# Patient Record
Sex: Female | Born: 1995 | Race: Black or African American | Hispanic: No | Marital: Single | Smoking: Never smoker
Health system: Southern US, Community
[De-identification: ages and names within clinical notes are randomized; demographics above are authoritative.]

## PROBLEM LIST (undated history)

## (undated) ENCOUNTER — Ambulatory Visit (HOSPITAL_COMMUNITY): Admission: EM | Payer: 59 | Source: Home / Self Care

## (undated) ENCOUNTER — Inpatient Hospital Stay (HOSPITAL_COMMUNITY): Payer: Medicaid Other

## (undated) DIAGNOSIS — F319 Bipolar disorder, unspecified: Secondary | ICD-10-CM

## (undated) DIAGNOSIS — F419 Anxiety disorder, unspecified: Secondary | ICD-10-CM

---
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
## 2015-10-04 ENCOUNTER — Emergency Department (HOSPITAL_COMMUNITY)
Admission: EM | Admit: 2015-10-04 | Discharge: 2015-10-04 | Disposition: A | Discharge status: Home / Self Care | Payer: BLUE CROSS/BLUE SHIELD | Attending: Emergency Medicine | Admitting: Emergency Medicine

## 2015-10-04 ENCOUNTER — Encounter (HOSPITAL_COMMUNITY): Payer: Self-pay

## 2015-10-04 DIAGNOSIS — R55 Syncope and collapse: Secondary | ICD-10-CM

## 2015-10-04 DIAGNOSIS — R51 Headache: Secondary | ICD-10-CM | POA: Diagnosis not present

## 2015-10-04 LAB — BASIC METABOLIC PANEL
Anion gap: 4 — ABNORMAL LOW (ref 5–15)
BUN: 8 mg/dL (ref 6–20)
CO2: 22 mmol/L (ref 22–32)
Calcium: 8.7 mg/dL — ABNORMAL LOW (ref 8.9–10.3)
Chloride: 111 mmol/L (ref 101–111)
Creatinine, Ser: 0.6 mg/dL (ref 0.44–1.00)
GFR calc Af Amer: >60 mL/min (ref >60)
GFR calc non Af Amer: >60 mL/min (ref >60)
GLUCOSE: 78 mg/dL (ref 65–99)
POTASSIUM: 4.1 mmol/L (ref 3.5–5.1)
Sodium: 137 mmol/L (ref 135–145)

## 2015-10-04 LAB — URINALYSIS, ROUTINE W REFLEX MICROSCOPIC
Bilirubin Urine: NEGATIVE
GLUCOSE, UA: NEGATIVE mg/dL
Hgb urine dipstick: NEGATIVE
KETONES UR: NEGATIVE mg/dL
Leukocytes, UA: NEGATIVE
Nitrite: NEGATIVE
Protein, ur: NEGATIVE mg/dL
SPECIFIC GRAVITY, URINE: 1.022 (ref 1.005–1.030)
pH: 6 (ref 5.0–8.0)

## 2015-10-04 LAB — CBC
HCT: 41.1 % (ref 36.0–46.0)
Hemoglobin: 13.8 g/dL (ref 12.0–15.0)
MCH: 31.4 pg (ref 26.0–34.0)
MCHC: 33.6 g/dL (ref 30.0–36.0)
MCV: 93.6 fL (ref 78.0–100.0)
Platelets: 273 10*3/uL (ref 150–400)
RBC: 4.39 MIL/uL (ref 3.87–5.11)
RDW: 13.2 % (ref 11.5–15.5)
WBC: 6 10*3/uL (ref 4.0–10.5)

## 2015-10-04 LAB — I-STAT TROPONIN, ED: Troponin i, poc: 0 ng/mL (ref 0.00–0.08)

## 2015-10-04 LAB — CBG MONITORING, ED: GLUCOSE-CAPILLARY: 81 mg/dL (ref 65–99)

## 2015-10-04 LAB — D-DIMER, QUANTITATIVE: D-Dimer, Quant: <0.27 ug/mL-FEU (ref 0.00–0.50)

## 2015-10-04 LAB — POC URINE PREG, ED: Preg Test, Ur: NEGATIVE

## 2015-10-04 MED ORDER — KETOROLAC TROMETHAMINE 30 MG/ML IJ SOLN
30.0000 mg | Freq: Once | INTRAMUSCULAR | Status: AC
  Administered 2015-10-04: 30 mg via INTRAVENOUS
  Filled 2015-10-04: qty 1

## 2015-10-04 MED ORDER — LORAZEPAM 0.5 MG PO TABS
0.5000 mg | ORAL_TABLET | Freq: Once | ORAL | Status: AC
  Administered 2015-10-04: 0.5 mg via ORAL
  Filled 2015-10-04: qty 1

## 2015-10-04 MED ORDER — SODIUM CHLORIDE 0.9 % IV BOLUS (SEPSIS)
1000.0000 mL | Freq: Once | INTRAVENOUS | Status: AC
  Administered 2015-10-04: 1000 mL via INTRAVENOUS

## 2015-10-04 NOTE — Discharge Instructions (Signed)
Make sure to drink plenty of fluids. Follow up with a family doctor if continue to have dizziness or if any more syncopal episodes. Return if worsening.     Syncope Syncope is a medical term for fainting or passing out. This means you lose consciousness and drop to the ground. People are generally unconscious for less than 5 minutes. You may have some muscle twitches for up to 15 seconds before waking up and returning to normal. Syncope occurs more often in older adults, but it can happen to anyone. While most causes of syncope are not dangerous, syncope can be a sign of a serious medical problem. It is important to seek medical care.  CAUSES  Syncope is caused by a sudden drop in blood flow to the brain. The specific cause is often not determined. Factors that can bring on syncope include:  Taking medicines that lower blood pressure.  Sudden changes in posture, such as standing up quickly.  Taking more medicine than prescribed.  Standing in one place for too long.  Seizure disorders.  Dehydration and excessive exposure to heat.  Low blood sugar (hypoglycemia).  Straining to have a bowel movement.  Heart disease, irregular heartbeat, or other circulatory problems.  Fear, emotional distress, seeing blood, or severe pain. SYMPTOMS  Right before fainting, you may:  Feel dizzy or light-headed.  Feel nauseous.  See all white or all black in your field of vision.  Have cold, clammy skin. DIAGNOSIS  Your health care provider will ask about your symptoms, perform a physical exam, and perform an electrocardiogram (ECG) to record the electrical activity of your heart. Your health care provider may also perform other heart or blood tests to determine the cause of your syncope which may include:  Transthoracic echocardiogram (TTE). During echocardiography, sound waves are used to evaluate how blood flows through your heart.  Transesophageal echocardiogram (TEE).  Cardiac monitoring.  This allows your health care provider to monitor your heart rate and rhythm in real time.  Holter monitor. This is a portable device that records your heartbeat and can help diagnose heart arrhythmias. It allows your health care provider to track your heart activity for several days, if needed.  Stress tests by exercise or by giving medicine that makes the heart beat faster. TREATMENT  In most cases, no treatment is needed. Depending on the cause of your syncope, your health care provider may recommend changing or stopping some of your medicines. HOME CARE INSTRUCTIONS  Have someone stay with you until you feel stable.  Do not drive, use machinery, or play sports until your health care provider says it is okay.  Keep all follow-up appointments as directed by your health care provider.  Lie down right away if you start feeling like you might faint. Breathe deeply and steadily. Wait until all the symptoms have passed.  Drink enough fluids to keep your urine clear or pale yellow.  If you are taking blood pressure or heart medicine, get up slowly and take several minutes to sit and then stand. This can reduce dizziness. SEEK IMMEDIATE MEDICAL CARE IF:   You have a severe headache.  You have unusual pain in the chest, abdomen, or back.  You are bleeding from your mouth or rectum, or you have black or tarry stool.  You have an irregular or very fast heartbeat.  You have pain with breathing.  You have repeated fainting or seizure-like jerking during an episode.  You faint when sitting or lying down.  You  have confusion.  You have trouble walking.  You have severe weakness.  You have vision problems. If you fainted, call your local emergency services (911 in [place] Do not drive yourself to the hospital.    This information is not intended to replace advice given to you by your health care provider. Make sure you discuss any questions you have with your health care provider.     Document Released: 03/24/2005 Document Revised: 08/08/2014 Document Reviewed: 05/23/2011 Elsevier Interactive Patient Education [employer]2016 [employer] Inc[employer]

## 2015-10-04 NOTE — ED Notes (Signed)
Bed: WTR8 Expected date:  Expected time:  Means of arrival:  Comments: EMS-syncope-triage

## 2015-10-04 NOTE — ED Provider Notes (Signed)
CSN: [id number]     Arrival date & time 10/04/15  1207 History   First MD Initiated Contact with Patient 10/04/15 1504     Chief Complaint  Patient presents with  . Loss of Consciousness     (Consider location/radiation/quality/duration/timing/severity/associated sxs/prior Treatment) HPI Wendy Pitts is a 20 y.o. female with no medical problems, presents to emergency department complaining of syncopal episode. 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 She states that for the last several days she has had mild headache, also reports she has had some chest pain that is intermittent and associated with shortness of breath. She denies any complaints at this time other than the headache. She has not tried taking any medications for this headache. She did not take anything this morning. She states that she has not had any to eat or drink this morning. States she had no appetite yesterday and did not eat or drink much yesterday either. She denies any fever or chills. No neck pain or stiffness. No chest pain, nausea, vomiting, diarrhea, abdominal pain at this time. No swelling in extremities. Denies being pregnant. She has had syncopal episodes in the past, with no diagnosis.  History reviewed. No pertinent past medical history. History reviewed. No pertinent past surgical history. No family history on file. Social History  Substance Use Topics  . Smoking status: Never Smoker   . Smokeless tobacco: Never Used  . Alcohol Use: No   OB History    No data available     Review of Systems  Constitutional: Negative for fever and chills.  Respiratory: Negative for cough, chest tightness and shortness of breath.   Cardiovascular: Negative for chest pain, palpitations and leg swelling.  Gastrointestinal: Negative for nausea, vomiting, abdominal pain  and diarrhea.  Genitourinary: Negative for dysuria, flank pain and vaginal bleeding.  Musculoskeletal: Negative for myalgias, arthralgias, neck pain and neck stiffness.  Skin: Negative for rash.  Neurological: Positive for syncope, light-headedness and headaches. Negative for dizziness and weakness.  All other systems reviewed and are negative.     Allergies  Review of patient's allergies indicates no known allergies.  Home Medications   Prior to Admission medications   Not on File   BP 104/66 mmHg  Pulse 73  Temp(Src) 98.5 F (36.9 C) (Oral)  Resp 16  Ht 5\' 4"  (1.626 m)  Wt 108.863 kg  BMI 41.18 kg/m2  SpO2 100%  LMP 08/21/2015 (Approximate) Physical Exam  Constitutional: She is oriented to person, place, and time. She appears well-developed and well-nourished. No distress.  HENT:  Head: Normocephalic and atraumatic.  Right Ear: External ear normal.  Left Ear: External ear normal.  Nose: Nose normal.  Mouth/Throat: Oropharynx is clear and moist.  Eyes: Conjunctivae and EOM are normal. Pupils are equal, round, and reactive to light.  Neck: Normal range of motion. Neck supple.  Cardiovascular: Normal rate, regular rhythm and normal heart sounds.   Pulmonary/Chest: Effort normal and breath sounds normal. No respiratory distress. She has no wheezes. She has no rales. She exhibits no tenderness.  Abdominal: Soft. Bowel sounds are normal. She exhibits no distension. There is no tenderness. There is no rebound.  Musculoskeletal: She exhibits no edema.  Neurological: She is alert and oriented to person, place, and time. No cranial nerve deficit. Coordination normal.  Skin: Skin is warm and dry.  Psychiatric:  She has a normal mood and affect. Her behavior is normal.  Nursing note and vitals reviewed.   ED Course  Procedures (including critical care time) Labs Review Labs Reviewed  BASIC METABOLIC PANEL - Abnormal; Notable for the following:    Calcium 8.7 (*)    Anion  gap 4 (*)    All other components within normal limits  CBC  URINALYSIS, ROUTINE W REFLEX MICROSCOPIC ([facility])  D-DIMER, QUANTITATIVE (NOT [facility])  CBG MONITORING, ED  CBG MONITORING, ED  POC URINE PREG, ED  I-STAT TROPOININ, ED    Imaging Review No results found. I have personally reviewed and evaluated these images and lab results as part of my medical decision-making.   EKG Interpretation   Date/Time:  Thursday October 04 2015 12:24:06 EDT Ventricular Rate:  68 PR Interval:    QRS Duration: 76 QT Interval:  394 QTC Calculation: 419 R Axis:   66 Text Interpretation:  Sinus rhythm Confirmed by Lincoln Brighamees, Liz [phone]([id number]) on  10/04/2015 2:53:33 PM      MDM   Final diagnoses:  Syncope, unspecified syncope type    Patient emergency department after syncopal episode at work. She states she hasn't had anything to drink or eat today. Denies being pregnant. History of similar syncopal episodes in the past. No diagnosis. Will check labs, d-dimer, pregnancy test, EKG.  EKG is unremarkable. CBC and basic metabolic panel all is negative. Troponin negative, d-dimer negative. Patient is mildly orthostatic. Hydrated with IV fluids. Patient requesting to be discharged home, states she feels better after fluids and Toradol for headache. Will discharge home with oral hydration and follow-up with family doctor.  Filed Vitals:   10/04/15 1212 10/04/15 1545 10/04/15 1548 10/04/15 1550  BP: 104/66 105/48 124/93 93/69  Pulse: 73 69 87 74  Temp: 98.5 F (36.9 C)     TempSrc: Oral     Resp: 16 18 18 19   Height: 5\' 4"  (1.626 m)     Weight: 108.863 kg     SpO2: 100% 99% 100%        Wendy Crumbleatyana Nakeem Murnane, Wendy Pitts 10/04/15 2024  Tilden FossaElizabeth Rees, MD 10/06/15 1159

## 2015-10-04 NOTE — ED Notes (Signed)
Pt presents with c/o positive LOC while at work. 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 Pt reported that she does remember feeling dizzy and sweaty prior to the syncopal episode. Per EMS, initial pressure of 116/84 but dropped to 68/42 en route. Pt has a 20 g IV in left hand placed by EMS with 500 ml of fluid given en route. Updated last pressure from EMS 110/68. Pt is alert and oriented at this time per EMS. Per EMS, positive orthostatic changes on scene, associated dizziness with standing. Pt c/o nausea and headache but denied any desire for zofran en route. Pt passed spinal exam per EMS, towel in place for precaution. Pt fell on tile floor, no obvious abnormalities, unsure of whether she hit her head.

## 2015-10-04 NOTE — Progress Notes (Signed)
Patient listed as having [employer] insurance without a pcp.  Patient reports she has just moved here.  [facility] who accept BCBS insurance within a 15 mile radius of patient's zip code [phone].  No further EDCM needs at this time.

## 2016-03-27 ENCOUNTER — Emergency Department (HOSPITAL_COMMUNITY): Payer: BLUE CROSS/BLUE SHIELD

## 2016-03-27 ENCOUNTER — Emergency Department (HOSPITAL_COMMUNITY)
Admission: EM | Admit: 2016-03-27 | Discharge: 2016-03-27 | Disposition: A | Discharge status: Home / Self Care | Payer: BLUE CROSS/BLUE SHIELD | Attending: Emergency Medicine | Admitting: Emergency Medicine

## 2016-03-27 ENCOUNTER — Encounter (HOSPITAL_COMMUNITY): Payer: Self-pay | Admitting: Emergency Medicine

## 2016-03-27 DIAGNOSIS — M542 Cervicalgia: Secondary | ICD-10-CM | POA: Diagnosis not present

## 2016-03-27 DIAGNOSIS — A599 Trichomoniasis, unspecified: Secondary | ICD-10-CM | POA: Insufficient documentation

## 2016-03-27 DIAGNOSIS — J020 Streptococcal pharyngitis: Secondary | ICD-10-CM | POA: Diagnosis not present

## 2016-03-27 DIAGNOSIS — J029 Acute pharyngitis, unspecified: Secondary | ICD-10-CM | POA: Diagnosis present

## 2016-03-27 DIAGNOSIS — N898 Other specified noninflammatory disorders of vagina: Secondary | ICD-10-CM

## 2016-03-27 DIAGNOSIS — Z79899 Other long term (current) drug therapy: Secondary | ICD-10-CM | POA: Insufficient documentation

## 2016-03-27 DIAGNOSIS — R0981 Nasal congestion: Secondary | ICD-10-CM

## 2016-03-27 LAB — WET PREP, GENITAL
Clue Cells Wet Prep HPF POC: NONE SEEN
Sperm: NONE SEEN
Yeast Wet Prep HPF POC: NONE SEEN

## 2016-03-27 LAB — RAPID STREP SCREEN (MED CTR MEBANE ONLY): Streptococcus, Group A Screen (Direct): POSITIVE — AB

## 2016-03-27 LAB — URINALYSIS, ROUTINE W REFLEX MICROSCOPIC
Bilirubin Urine: NEGATIVE
Glucose, UA: NEGATIVE mg/dL
HGB URINE DIPSTICK: NEGATIVE
Ketones, ur: 5 mg/dL — AB
Leukocytes, UA: NEGATIVE
Nitrite: NEGATIVE
PH: 6 (ref 5.0–8.0)
Protein, ur: NEGATIVE mg/dL
SPECIFIC GRAVITY, URINE: 1.031 — AB (ref 1.005–1.030)

## 2016-03-27 LAB — RAPID HIV SCREEN (HIV 1/2 AB+AG)
HIV 1/2 ANTIBODIES: NONREACTIVE
HIV-1 P24 Antigen - HIV24: NONREACTIVE

## 2016-03-27 LAB — POC URINE PREG, ED: Preg Test, Ur: NEGATIVE

## 2016-03-27 LAB — MONONUCLEOSIS SCREEN: Mono Screen: NEGATIVE

## 2016-03-27 MED ORDER — METRONIDAZOLE 500 MG PO TABS
2000.0000 mg | ORAL_TABLET | Freq: Once | ORAL | Status: AC
  Administered 2016-03-27: 2000 mg via ORAL
  Filled 2016-03-27: qty 4

## 2016-03-27 MED ORDER — AMOXICILLIN 500 MG PO CAPS: 500.0000 mg | ORAL_CAPSULE | Freq: Two times a day (BID) | ORAL | 0 refills | Status: AC

## 2016-03-27 NOTE — ED Notes (Signed)
When RN tried to place IV, IV was flushing and had appropriate blood return but the patient stated that she wanted it taken out.  Another RN verified that it was a working IV & explained to patient that it ws needed for the CT with contrast, but again patient demanded that it be removed. IV was removed and patient refused being stuck by anyone else.  PA was informed.

## 2016-03-27 NOTE — ED Notes (Signed)
PA is in the room doing a pelvic

## 2016-03-27 NOTE — ED Provider Notes (Signed)
[facility]-EMERGENCY DEPT Provider Note   CSN: [id number] Arrival date & time: 03/27/16  1600  History   Chief Complaint Chief Complaint  Patient presents with  . Otalgia  . Ear Fullness  . Vaginal Discharge    HPI Wendy Pitts is a 20 y.o. female with no pmh who presents with two concerns.   Pt reports severe sore throat, bilateral ear pain with fullness and muffled hearing, swollen and tender anterior neck, nasal congestion, productive cough, sinus pressure and pain, fever.  Symptoms x 1 week.  Pt states her niece was diagnosed with strep throat and ear infection recently, pt lives with her niece.  Pt states her boss today told her her neck looked swollen and recommended she went to the doctor.    Pt also reports vaginal itching, vaginal discharge.  Pt states vaginal discharge is white and clumpy.  No vaginal odors.  Pt denies urinary symptoms, low back pain, pelvic pain.  Pt is sexually active without use of barrier or hormonal contraception.  LMP 02/25/16.  Pt unsure of pregnancy status.  Pt requests STD testing today.  Pt states she has been tested for STDs before and has never tested positive. No nausea, vomiting, abdominal pain, diarrhea, constipation, vaginal sores.  HPI  History reviewed. No pertinent past medical history.  There are no active problems to display for this patient.   History reviewed. No pertinent surgical history.  OB History    No data available       Home Medications    Prior to Admission medications   Medication Sig Start Date End Date Taking? Authorizing Provider  diphenhydramine-acetaminophen (TYLENOL PM) 25-500 MG TABS tablet Take 2 tablets by mouth at bedtime as needed (sleep).   Yes Historical Provider, MD  amoxicillin (AMOXIL) 500 MG capsule Take 1 capsule (500 mg total) by mouth 2 (two) times daily. 03/27/16 04/06/16  Liberty Handylaudia J Najee Cowens, PA-C    Family History No family history on file.  Social History Social History  Substance Use  Topics  . Smoking status: Never Smoker  . Smokeless tobacco: Never Used  . Alcohol use No     Allergies   Patient has no known allergies.   Review of Systems Review of Systems  Constitutional: Positive for fever.  HENT: Positive for congestion, drooling, ear pain, rhinorrhea, sinus pain, sinus pressure, sore throat and trouble swallowing.   Eyes: Negative for pain and visual disturbance.  Respiratory: Negative for cough, chest tightness and shortness of breath.   Cardiovascular: Negative for chest pain.  Gastrointestinal: Negative for abdominal pain, anal bleeding, constipation, diarrhea, nausea and vomiting.  Genitourinary: Positive for vaginal discharge. Negative for difficulty urinating, dysuria, flank pain, frequency, hematuria, menstrual problem, pelvic pain and vaginal bleeding.  Musculoskeletal: Negative for back pain.  Skin: Negative for rash.  Neurological: Positive for headaches. Negative for dizziness and weakness.  Hematological: Does not bruise/bleed easily.  Psychiatric/Behavioral: The patient is not nervous/anxious.      Physical Exam Updated Vital Signs BP 128/76 (BP Location: Right Arm)   Pulse 96   Temp 98.3 F (36.8 C) (Oral)   Resp 14   Ht 5\' 4"  (1.626 m)   Wt 108.9 kg   SpO2 95%   BMI 41.20 kg/m   Physical Exam  Constitutional: She is oriented to person, place, and time. She appears well-developed and well-nourished. No distress.  HENT:  Head: Normocephalic and atraumatic.  S/p tonsillectomy. Oropharynx mildly erythematous and non edematous, moist. No pooling of oral secretions, no trismus.  No dental or sublingual fluctuance, swelling or tenderness. TM pearly gray with cone of light, non bulging non erythematous, no signs of fluids. Inferior turbinates mildly edematous and erythematous, no nasal discharge.   Eyes: Conjunctivae and EOM are normal. Pupils are equal, round, and reactive to light.  Neck:  ROM of neck normal, although pt reports  anterior neck pain with neck rotation, extension and flexion.  Mildly edematous anterior neck bilaterally.  Tender and swollen anterior cervical lymph nodes bilaterally.   Cardiovascular: Normal rate, regular rhythm and normal heart sounds.   No murmur heard. Pulmonary/Chest: Effort normal and breath sounds normal. She has no wheezes.  Abdominal:  Abdomen soft, non tender non distended without guarding or rigidity.  No suprapubic or CVA tenderness.   Genitourinary:  Genitourinary Comments: No external vaginal or anal lesions noted.  Thin, grayish white, malodorous vaginal discharge noted.  Cervix closed without lesions.  No CMT.  Ovaries non palpable.   Musculoskeletal: Normal range of motion.  Neurological: She is alert and oriented to person, place, and time.  Skin: Capillary refill takes less than 2 seconds. No rash noted.  Psychiatric: She has a normal mood and affect.     ED Treatments / Results  Labs (all labs ordered are listed, but only abnormal results are displayed) Labs Reviewed  RAPID STREP SCREEN (NOT [facility]) - Abnormal; Notable for the following:       Result Value   Streptococcus, Group A Screen (Direct) POSITIVE (*)    All other components within normal limits  WET PREP, GENITAL - Abnormal; Notable for the following:    Trich, Wet Prep PRESENT (*)    WBC, Wet Prep HPF POC MODERATE (*)    All other components within normal limits  URINALYSIS, ROUTINE W REFLEX MICROSCOPIC - Abnormal; Notable for the following:    Specific Gravity, Urine 1.031 (*)    Ketones, ur 5 (*)    All other components within normal limits  RAPID HIV SCREEN (HIV 1/2 AB+AG)  MONONUCLEOSIS SCREEN  POC URINE PREG, ED  GC/CHLAMYDIA PROBE AMP ([place]) NOT AT [facility]    EKG  EKG Interpretation None       Radiology Ct Soft Tissue Neck Wo Contrast  Result Date: 03/27/2016 CLINICAL DATA:  Anterior neck swelling and fever. EXAM: CT NECK WITHOUT CONTRAST TECHNIQUE: Multidetector CT imaging  of the neck was performed following the standard protocol without intravenous contrast. COMPARISON:  None. FINDINGS: Limited sensitivity due to noncontrast technique. Pharynx and larynx: Symmetric thickening of the adenoid tonsils. Mild enlargement of the palatini tonsils. No detected edema or mass. Widely patent airway. Salivary glands: Negative Thyroid: Negative Lymph nodes: Symmetric enlargement of bilateral cervical lymph nodes which retain a ovoid shape. No abnormal lymph node density. Vascular: Negative noncontrast appearance Limited intracranial: Negative Visualized orbits: Possible dysconjugate gaze, nonspecific. Mastoids and visualized paranasal sinuses: Mucous retention cysts in the right maxillary sinus. Skeleton: Negative. Upper chest: Negative IMPRESSION: Adenoid thickening and enlargement of bilateral cervical lymph nodes. In the setting of fever these changes are presumably infectious/reactive. Recommend clinical followup to normalization. Electronically Signed   By: Marnee SpringJonathon  Watts M.D.   On: 03/27/2016 19:30    Procedures Procedures (including critical care time)  Pelvic exam: normal external genitalia, vulva, vagina, cervix, uterus and adnexa.  No external vaginal or anal lesions noted.  Thin, grayish white, malodorous vaginal discharge noted.  Cervix closed without lesions.  No CMT.  Ovaries non palpable.  Tech in room for exam. Pt tolerated  procedures well without complications.     Medications Ordered in ED Medications  metroNIDAZOLE (FLAGYL) tablet 2,000 mg (2,000 mg Oral Given 03/27/16 1932)     Initial Impression / Assessment and Plan / ED Course  I have reviewed the triage vital signs and the nursing notes.  Pertinent labs & imaging results that were available during my care of the patient were reviewed by me and considered in my medical decision making (see chart for details).  Clinical Course as of Mar 29 1255  Thu Mar 27, 2016  1810 Pt declined IV for contrast for CT  neck. Will order non con CT neck  [CG]    Clinical Course User Index [CG] Liberty Handy, PA-C   +rapid strep.  + trich.  Given anterior neck swelling and pain, ordered CT neck with contrast to rule out deep neck abscess.  Pt declined IV contrast, ordered non con study which did not suggest signs of deep tissue abscess.  Discussed lab results with patient.  Advised her to communicate results with sexual partners as they need test and treatment.  Strict ED return instructions given - difficulty controlling oral secretions, neck stiffness, fevers, worsening anterior neck pain/swelling.  Pt verbalized understanding.  Pt given 2g flagyl in ED. Pt will be discharged with abx for strep and symptomatic treatment for sore throat.     Final Clinical Impressions(s) / ED Diagnoses   Final diagnoses:  Nasal congestion  Anterior neck pain  Sore throat  Vaginal discharge  Trichimoniasis  Strep pharyngitis    New Prescriptions Discharge Medication List as of 03/27/2016  8:19 PM       Liberty Handy, PA-C 03/28/16 1257    Liberty Handy, PA-C 03/28/16 1257    Canary Brim Tegeler, MD 03/28/16 1335

## 2016-03-27 NOTE — ED Triage Notes (Addendum)
Patient here from home with complaints of bilateral ear pain and fullness. Reports that she is having difficulty hearing out of both ears. Also complains of neck pain bilateral lymph nodes. Also here for STD testing. Vaginal itching and white thick discharge.

## 2016-03-27 NOTE — Discharge Instructions (Addendum)
You tested positive today for trichomoniasis.  Please read the attached information about "trichomoniasis" and "sexually transmitted diseases".  It is important that you speak to those with whom you have been sexually active because they need testing and treatment.   You tested positive for strep pharyngitis.  Please take amoxicillin as prescribed for the next 10 days.   Your CT scan came back negative for a deep tissue abscess.  Your neck swelling and pain is likely a reaction to strep.   Please return to emergency department if your symptoms worsen.

## 2016-03-27 NOTE — ED Notes (Signed)
Advised PA of patient results being back.  PA to talk with patient.

## 2016-03-28 LAB — GC/CHLAMYDIA PROBE AMP (~~LOC~~) NOT AT ARMC
Chlamydia: NEGATIVE
Neisseria Gonorrhea: NEGATIVE

## 2016-05-07 ENCOUNTER — Emergency Department (HOSPITAL_COMMUNITY): Payer: BLUE CROSS/BLUE SHIELD

## 2016-05-07 ENCOUNTER — Emergency Department (HOSPITAL_COMMUNITY)
Admission: EM | Admit: 2016-05-07 | Discharge: 2016-05-07 | Disposition: A | Discharge status: Home / Self Care | Payer: BLUE CROSS/BLUE SHIELD | Attending: Emergency Medicine | Admitting: Emergency Medicine

## 2016-05-07 ENCOUNTER — Encounter (HOSPITAL_COMMUNITY): Payer: Self-pay

## 2016-05-07 DIAGNOSIS — R112 Nausea with vomiting, unspecified: Secondary | ICD-10-CM | POA: Diagnosis not present

## 2016-05-07 DIAGNOSIS — N342 Other urethritis: Secondary | ICD-10-CM | POA: Insufficient documentation

## 2016-05-07 DIAGNOSIS — R197 Diarrhea, unspecified: Secondary | ICD-10-CM | POA: Diagnosis not present

## 2016-05-07 DIAGNOSIS — R109 Unspecified abdominal pain: Secondary | ICD-10-CM | POA: Diagnosis present

## 2016-05-07 LAB — POC URINE PREG, ED: PREG TEST UR: NEGATIVE

## 2016-05-07 LAB — COMPREHENSIVE METABOLIC PANEL
ALK PHOS: 73 U/L (ref 38–126)
ALT: 16 U/L (ref 14–54)
ANION GAP: 5 (ref 5–15)
AST: 21 U/L (ref 15–41)
Albumin: 3.6 g/dL (ref 3.5–5.0)
BILIRUBIN TOTAL: 0.5 mg/dL (ref 0.3–1.2)
BUN: 7 mg/dL (ref 6–20)
CALCIUM: 9.1 mg/dL (ref 8.9–10.3)
CO2: 24 mmol/L (ref 22–32)
Chloride: 109 mmol/L (ref 101–111)
Creatinine, Ser: 0.76 mg/dL (ref 0.44–1.00)
GLUCOSE: 80 mg/dL (ref 65–99)
POTASSIUM: 3.8 mmol/L (ref 3.5–5.1)
Sodium: 138 mmol/L (ref 135–145)
TOTAL PROTEIN: 7.2 g/dL (ref 6.5–8.1)

## 2016-05-07 LAB — URINALYSIS, ROUTINE W REFLEX MICROSCOPIC
BILIRUBIN URINE: NEGATIVE
Glucose, UA: 100 mg/dL — AB
Ketones, ur: 15 mg/dL — AB
NITRITE: POSITIVE — AB
Protein, ur: >300 mg/dL — AB
SPECIFIC GRAVITY, URINE: 1.025 (ref 1.005–1.030)
pH: 5 (ref 5.0–8.0)

## 2016-05-07 LAB — URINALYSIS, MICROSCOPIC (REFLEX)

## 2016-05-07 LAB — CBC
HCT: 41.4 % (ref 36.0–46.0)
HEMOGLOBIN: 13.7 g/dL (ref 12.0–15.0)
MCH: 31.5 pg (ref 26.0–34.0)
MCHC: 33.1 g/dL (ref 30.0–36.0)
MCV: 95.2 fL (ref 78.0–100.0)
Platelets: 221 10*3/uL (ref 150–400)
RBC: 4.35 MIL/uL (ref 3.87–5.11)
RDW: 13.4 % (ref 11.5–15.5)
WBC: 6.1 10*3/uL (ref 4.0–10.5)

## 2016-05-07 LAB — LIPASE, BLOOD: Lipase: 22 U/L (ref 11–51)

## 2016-05-07 MED ORDER — PROMETHAZINE HCL 25 MG/ML IJ SOLN
12.5000 mg | Freq: Once | INTRAMUSCULAR | Status: AC
  Administered 2016-05-07: 12.5 mg via INTRAVENOUS
  Filled 2016-05-07: qty 1

## 2016-05-07 MED ORDER — FLUCONAZOLE 100 MG PO TABS
150.0000 mg | ORAL_TABLET | Freq: Once | ORAL | Status: AC
  Administered 2016-05-07: 150 mg via ORAL
  Filled 2016-05-07: qty 2

## 2016-05-07 MED ORDER — SODIUM CHLORIDE 0.9 % IV BOLUS (SEPSIS)
1000.0000 mL | Freq: Once | INTRAVENOUS | Status: AC
  Administered 2016-05-07: 1000 mL via INTRAVENOUS

## 2016-05-07 MED ORDER — ONDANSETRON 4 MG PO TBDP: ORAL_TABLET | ORAL | 0 refills | Status: DC

## 2016-05-07 MED ORDER — DEXTROSE 5 % IV SOLN
1.0000 g | Freq: Once | INTRAVENOUS | Status: AC
  Administered 2016-05-07: 1 g via INTRAVENOUS
  Filled 2016-05-07: qty 10

## 2016-05-07 MED ORDER — ACETAMINOPHEN 325 MG PO TABS
650.0000 mg | ORAL_TABLET | Freq: Once | ORAL | Status: AC
  Administered 2016-05-07: 650 mg via ORAL
  Filled 2016-05-07: qty 2

## 2016-05-07 MED ORDER — CIPROFLOXACIN HCL 500 MG PO TABS: 500.0000 mg | ORAL_TABLET | Freq: Two times a day (BID) | ORAL | 0 refills | Status: DC

## 2016-05-07 MED ORDER — ONDANSETRON 4 MG PO TBDP
ORAL_TABLET | ORAL | Status: AC
  Filled 2016-05-07: qty 1

## 2016-05-07 MED ORDER — ONDANSETRON 4 MG PO TBDP
4.0000 mg | ORAL_TABLET | Freq: Once | ORAL | Status: AC | PRN
  Administered 2016-05-07: 4 mg via ORAL

## 2016-05-07 NOTE — ED Notes (Signed)
Pt transported to xray 

## 2016-05-07 NOTE — ED Provider Notes (Signed)
[facility]-EMERGENCY DEPT Provider Note   CSN: [id number] Arrival date & time: 05/07/16  1722   By signing my name below, I, Soijett Blue, attest that this documentation has been prepared under the direction and in the presence of Charlynne Pander, MD. Electronically Signed: Soijett Blue, ED Scribe. 05/07/16. 7:07 PM.  History   Chief Complaint Chief Complaint  Patient presents with  . Cough  . Emesis  . Generalized Body Aches    HPI Wendy Pitts is a 21 y.o. female who presents to the Emergency Department complaining of generalized body aches onset earlier today. Pt reports associated intermittent cough, subjective fever, chills, diarrhea, nausea, vomiting, and abdominal pain. Pt hasn't tried any medications for the relief of her symptoms. Pt denies any other symptoms. Denies pregnancy at this time. Pt notes that she is otherwise healthy and doesn't take daily medications. Pt notes that she is on her second day of her menstrual cycle.   The history is provided by the patient. No language interpreter was used.    History reviewed. No pertinent past medical history.  There are no active problems to display for this patient.   History reviewed. No pertinent surgical history.  OB History    No data available       Home Medications    Prior to Admission medications   Medication Sig Start Date End Date Taking? Authorizing Provider  diphenhydramine-acetaminophen (TYLENOL PM) 25-500 MG TABS tablet Take 2 tablets by mouth at bedtime as needed (sleep).    Historical Provider, MD    Family History History reviewed. No pertinent family history.  Social History Social History  Substance Use Topics  . Smoking status: Never Smoker  . Smokeless tobacco: Never Used  . Alcohol use No     Allergies   Patient has no known allergies.   Review of Systems Review of Systems  Constitutional: Positive for chills and fever (subjective).  Respiratory: Positive for cough.     Gastrointestinal: Positive for diarrhea, nausea and vomiting.  Musculoskeletal: Positive for myalgias.  All other systems reviewed and are negative.    Physical Exam Updated Vital Signs BP 115/63   Pulse 75   Temp 98.8 F (37.1 C)   Resp 18   LMP 05/07/2016 (Exact Date)   SpO2 97%   Physical Exam  Constitutional: She is oriented to person, place, and time. She appears well-developed and well-nourished. No distress.  HENT:  Head: Normocephalic and atraumatic.  Mouth/Throat: Uvula is midline and oropharynx is clear and moist. Mucous membranes are dry.  Eyes: EOM are normal.  Neck: Neck supple.  Cardiovascular: Normal rate, regular rhythm and normal heart sounds.  Exam reveals no gallop and no friction rub.   No murmur heard. Pulmonary/Chest: Effort normal and breath sounds normal. No respiratory distress. She has no wheezes. She has no rales.  Abdominal: Soft. She exhibits no distension. There is no tenderness.  Musculoskeletal: Normal range of motion.  Neurological: She is alert and oriented to person, place, and time.  Skin: Skin is warm and dry.  Psychiatric: She has a normal mood and affect. Her behavior is normal.  Nursing note and vitals reviewed.    ED Treatments / Results  DIAGNOSTIC STUDIES: Oxygen Saturation is 97% on RA, nl by my interpretation.    COORDINATION OF CARE: 6:59 PM Discussed treatment plan with pt at bedside which includes labs, UA, CXR, and pt agreed to plan.   Labs (all labs ordered are listed, but only abnormal results are displayed)  Labs Reviewed  URINALYSIS, ROUTINE W REFLEX MICROSCOPIC - Abnormal; Notable for the following:       Result Value   Color, Urine RED (*)    APPearance TURBID (*)    Glucose, UA 100 (*)    Hgb urine dipstick LARGE (*)    Ketones, ur 15 (*)    Protein, ur >300 (*)    Nitrite POSITIVE (*)    Leukocytes, UA SMALL (*)    All other components within normal limits  URINALYSIS, MICROSCOPIC (REFLEX) - Abnormal;  Notable for the following:    Bacteria, UA MANY (*)    Squamous Epithelial / LPF 6-30 (*)    All other components within normal limits  URINE CULTURE  LIPASE, BLOOD  COMPREHENSIVE METABOLIC PANEL  CBC  POC URINE PREG, ED    Radiology Dg Chest 2 View  Result Date: 05/07/2016 CLINICAL DATA:  Cough and dyspnea for 1 day. EXAM: CHEST  2 VIEW COMPARISON:  None. FINDINGS: The heart size and mediastinal contours are within normal limits. Both lungs are clear. The visualized skeletal structures are unremarkable. IMPRESSION: No active cardiopulmonary disease. Electronically Signed   By: Ellery Plunkaniel R Mitchell M.D.   On: 05/07/2016 20:14    Procedures Procedures (including critical care time)  Medications Ordered in ED Medications  cefTRIAXone (ROCEPHIN) 1 g in dextrose 5 % 50 mL IVPB (not administered)  ondansetron (ZOFRAN-ODT) disintegrating tablet 4 mg (4 mg Oral Given 05/07/16 1759)  sodium chloride 0.9 % bolus 1,000 mL (1,000 mLs Intravenous New Bag/Given 05/07/16 1926)  promethazine (PHENERGAN) injection 12.5 mg (12.5 mg Intravenous Given 05/07/16 1926)  acetaminophen (TYLENOL) tablet 650 mg (650 mg Oral Given 05/07/16 1926)     Initial Impression / Assessment and Plan / ED Course  I have reviewed the triage vital signs and the nursing notes.  Pertinent labs & imaging results that were available during my care of the patient were reviewed by me and considered in my medical decision making (see chart for details).     Wendy Pitts is a 21 y.o. female here with chills, vomiting, diarrhea, abdominal pain. Afebrile in the ED. Appears dehydrated. She is on her period currently. Likely gastro vs viral syndrome. Will get labs, UA, CXR. Will hydrate and reassess.   8:58 PM Felt better with IVF and zofran and phenergan. No vomiting in the ED. Abdomen still nontender. UA + blood (she is on her period) but also nitrates and bacteria. Not sure if she has UTI vs contamination from gastro. Will give  rocephin, dc home with course of cipro which will cover most enteric organisms as well as UTI. Will dc home.    Final Clinical Impressions(s) / ED Diagnoses   Final diagnoses:  None    New Prescriptions New Prescriptions   No medications on file   I personally performed the services described in this documentation, which was scribed in my presence. The recorded information has been reviewed and is accurate.    Charlynne Panderavid Hsienta Yao, MD 05/07/16 2100

## 2016-05-07 NOTE — Discharge Instructions (Signed)
Take cipro twice daily for a week. Please avoid strenuous exercise as it may cause tendon rupture.   Take zofran for nausea.   Stay hydrated.  Take tylenol, motrin for fever.   See your doctor  Return to ER if you have fever for a week, severe abdominal pain, dehydration, vomiting.

## 2016-05-07 NOTE — ED Notes (Signed)
Pt verbalized understanding of d/c instructions and has no further questions. Pt stable and NAD.  

## 2016-05-07 NOTE — ED Triage Notes (Signed)
Pt complaining of nausea, vomiting and diarrhea and generalized body aches x 1 day. Pt complaining of fever and chills. Pt states dry cough.

## 2016-05-09 LAB — URINE CULTURE

## 2016-05-12 ENCOUNTER — Encounter: Payer: Self-pay | Admitting: Family Medicine

## 2016-07-10 ENCOUNTER — Emergency Department (HOSPITAL_COMMUNITY)
Admission: EM | Admit: 2016-07-10 | Discharge: 2016-07-11 | Disposition: A | Discharge status: Home / Self Care | Payer: BLUE CROSS/BLUE SHIELD | Attending: Emergency Medicine | Admitting: Emergency Medicine

## 2016-07-10 ENCOUNTER — Emergency Department (HOSPITAL_COMMUNITY): Payer: BLUE CROSS/BLUE SHIELD

## 2016-07-10 ENCOUNTER — Encounter (HOSPITAL_COMMUNITY): Payer: Self-pay | Admitting: Emergency Medicine

## 2016-07-10 DIAGNOSIS — A5900 Urogenital trichomoniasis, unspecified: Secondary | ICD-10-CM | POA: Diagnosis not present

## 2016-07-10 DIAGNOSIS — N6452 Nipple discharge: Secondary | ICD-10-CM | POA: Insufficient documentation

## 2016-07-10 DIAGNOSIS — A599 Trichomoniasis, unspecified: Secondary | ICD-10-CM

## 2016-07-10 LAB — URINALYSIS, ROUTINE W REFLEX MICROSCOPIC
Bilirubin Urine: NEGATIVE
Glucose, UA: NEGATIVE mg/dL
Hgb urine dipstick: NEGATIVE
Ketones, ur: NEGATIVE mg/dL
Leukocytes, UA: NEGATIVE
NITRITE: NEGATIVE
Protein, ur: NEGATIVE mg/dL
SPECIFIC GRAVITY, URINE: 1.029 (ref 1.005–1.030)
pH: 5 (ref 5.0–8.0)

## 2016-07-10 LAB — WET PREP, GENITAL
SPERM: NONE SEEN
Yeast Wet Prep HPF POC: NONE SEEN

## 2016-07-10 LAB — PREGNANCY, URINE: Preg Test, Ur: NEGATIVE

## 2016-07-10 NOTE — ED Provider Notes (Signed)
[facility]-EMERGENCY DEPT Provider Note   CSN: [id number] Arrival date & time: 07/10/16  1943  By signing my name below, I, Linna Darner, attest that this documentation has been prepared under the direction and in the presence of [facility], PA-C. Electronically Signed: Linna Darner, Scribe. 07/10/2016. 10:23 PM.  History   Chief Complaint Chief Complaint  Patient presents with  . Breast Discharge  . Vaginal Discharge    The history is provided by the patient. No language interpreter was used.     HPI Comments: Wendy Pitts is a 21 y.o. female who presents to the Emergency Department complaining of persistent, unchanged bilateral breast discharge for one month. She states her breast discharge occurs daily. Pt states her breast discharge is usually clear in appearance but sometimes "creamy" and she is able to express the discharge on her own. She reports some associated sharp pain in her breasts. No medications or treatments tried. Patient notes a FMHx of breast cancer including multiple cousins and aunts. She denies rashes/sores to her breasts, fevers, chills, or any other associated symptoms.  She is also reporting persistent, unchanged vaginal discharge for one week. Pt notes associated LLQ pain, left-sided pelvic pain and some intermittent pain with urination. She states her discharge is brown in appearance. Patient states her LMP ended 3-4 days ago and her flow was normal, but the blood appeared lighter than usual. No medications or treatments tried. Pt endorses some recent unprotected sexual intercourse. She is not using any hormone therapy. She denies back pain or any other associated symptoms. Patient is not followed by an OB/GYN.  History reviewed. No pertinent past medical history.  There are no active problems to display for this patient.   History reviewed. No pertinent surgical history.  OB History    No data available       Home Medications    Prior to Admission  medications   Medication Sig Start Date End Date Taking? Authorizing Provider  ciprofloxacin (CIPRO) 500 MG tablet Take 1 tablet (500 mg total) by mouth 2 (two) times daily. One po bid x 7 days Patient not taking: Reported on 07/10/2016 05/07/16   Charlynne Pander, MD  ondansetron (ZOFRAN ODT) 4 MG disintegrating tablet  ODT q6 hours prn nausea/vomit Patient not taking: Reported on 07/10/2016 05/07/16   Charlynne Pander, MD    Family History No family history on file.  Social History Social History  Substance Use Topics  . Smoking status: Never Smoker  . Smokeless tobacco: Never Used  . Alcohol use No     Allergies   Patient has no known allergies.   Review of Systems Review of Systems  Constitutional: Negative for chills and fever.  Cardiovascular: Positive for chest pain (pain in bilateral breasts).  Gastrointestinal: Positive for abdominal pain.  Genitourinary: Positive for dysuria, pelvic pain and vaginal discharge.  Musculoskeletal: Negative for back pain.  Skin: Negative for rash.   Physical Exam Updated Vital Signs BP 111/62   Pulse 79   Temp 98.2 F (36.8 C) (Oral)   Resp 18   Ht  (1.626 m)   Wt 117.9 kg   LMP 06/30/2016   SpO2 98%   BMI 44.63 kg/m   Physical Exam  Constitutional: She is oriented to person, place, and time. She appears well-developed and well-nourished. No distress.  Nontoxic appearing.  HENT:  Head: Normocephalic and atraumatic.  Cardiovascular: Normal rate, regular rhythm and normal heart sounds.   No murmur heard. Pulmonary/Chest: Effort normal and  breath sounds normal. No respiratory distress.  Bilateral breasts with no masses or nodules appreciated. No overlying skin changes. No nipple deviation or discharge. Diffuse generalized tenderness to palpation.  Abdominal: Soft. She exhibits no distension. There is tenderness.  Tenderness to palpation of left lower quadrant. No CVA tenderness. No rebound or guarding.  Genitourinary:    Genitourinary Comments: Chaperone present for exam. + discharge. + left adnexal tenderness. No fullness or masses appreciated.  No rashes, lesions, or tenderness to external genitalia. No CMT. No bleeding within vaginal vault.  Neurological: She is alert and oriented to person, place, and time.  Skin: Skin is warm and dry.  Nursing note and vitals reviewed.  ED Treatments / Results  Labs (all labs ordered are listed, but only abnormal results are displayed) Labs Reviewed  WET PREP, GENITAL - Abnormal; Notable for the following:       Result Value   Trich, Wet Prep PRESENT (*)    Clue Cells Wet Prep HPF POC PRESENT (*)    WBC, Wet Prep HPF POC MODERATE (*)    All other components within normal limits  URINALYSIS, ROUTINE W REFLEX MICROSCOPIC - Abnormal; Notable for the following:    APPearance CLOUDY (*)    All other components within normal limits  PREGNANCY, URINE  RPR  HIV ANTIBODY (ROUTINE TESTING)  GC/CHLAMYDIA PROBE AMP ([place]) NOT AT [facility]    EKG  EKG Interpretation None       Radiology US Transvaginal Non-ob  Result Date: 07/11/2016 CLINICAL DATA:  Left lower quadrant pain EXAM: TRANSABDOMINAL AND TRANSVAGINAL ULTRASOUND OF PELVIS DOPPLER ULTRASOUND OF OVARIES TECHNIQUE: Both transabdominal and transvaginal ultrasound examinations of the pelvis were performed. Transabdominal technique was performed for global imaging of the pelvis including uterus, ovaries, adnexal regions, and pelvic cul-de-sac. It was necessary to proceed with endovaginal exam following the transabdominal exam to visualize the ovaries. Color and duplex Doppler ultrasound was utilized to evaluate blood flow to the ovaries. COMPARISON:  None. FINDINGS: Uterus Measurements: 7.8 x 3.4 x 3.6 cm. No fibroids or other mass visualized. Endometrium Thickness: 5.5 mm.  No focal abnormality visualized. Right ovary Measurements: 3.3 x 2.4 x 2.3 cm. Normal appearance/no adnexal mass. Left ovary Measurements: 3.4  x 2.1 x 2.0 cm. Normal appearance/no adnexal mass. Pulsed Doppler evaluation of both ovaries demonstrates normal low-resistance arterial and venous waveforms. Other findings No abnormal free fluid. IMPRESSION: Normal uterus and ovaries. Doppler confirms intact perfusion of both ovaries. Electronically Signed   By: Ellery Plunk M.D.   On: 07/11/2016 01:20   US Pelvis Complete  Result Date: 07/11/2016 CLINICAL DATA:  Left lower quadrant pain EXAM: TRANSABDOMINAL AND TRANSVAGINAL ULTRASOUND OF PELVIS DOPPLER ULTRASOUND OF OVARIES TECHNIQUE: Both transabdominal and transvaginal ultrasound examinations of the pelvis were performed. Transabdominal technique was performed for global imaging of the pelvis including uterus, ovaries, adnexal regions, and pelvic cul-de-sac. It was necessary to proceed with endovaginal exam following the transabdominal exam to visualize the ovaries. Color and duplex Doppler ultrasound was utilized to evaluate blood flow to the ovaries. COMPARISON:  None. FINDINGS: Uterus Measurements: 7.8 x 3.4 x 3.6 cm. No fibroids or other mass visualized. Endometrium Thickness: 5.5 mm.  No focal abnormality visualized. Right ovary Measurements: 3.3 x 2.4 x 2.3 cm. Normal appearance/no adnexal mass. Left ovary Measurements: 3.4 x 2.1 x 2.0 cm. Normal appearance/no adnexal mass. Pulsed Doppler evaluation of both ovaries demonstrates normal low-resistance arterial and venous waveforms. Other findings No abnormal free fluid. IMPRESSION: Normal uterus and  ovaries. Doppler confirms intact perfusion of both ovaries. Electronically Signed   By: Ellery Plunk M.D.   On: 07/11/2016 01:20   [place] Art/ven Flow Abd Pelv Doppler  Result Date: 07/11/2016 CLINICAL DATA:  Left lower quadrant pain EXAM: TRANSABDOMINAL AND TRANSVAGINAL ULTRASOUND OF PELVIS DOPPLER ULTRASOUND OF OVARIES TECHNIQUE: Both transabdominal and transvaginal ultrasound examinations of the pelvis were performed. Transabdominal technique was  performed for global imaging of the pelvis including uterus, ovaries, adnexal regions, and pelvic cul-de-sac. It was necessary to proceed with endovaginal exam following the transabdominal exam to visualize the ovaries. Color and duplex Doppler ultrasound was utilized to evaluate blood flow to the ovaries. COMPARISON:  None. FINDINGS: Uterus Measurements: 7.8 x 3.4 x 3.6 cm. No fibroids or other mass visualized. Endometrium Thickness: 5.5 mm.  No focal abnormality visualized. Right ovary Measurements: 3.3 x 2.4 x 2.3 cm. Normal appearance/no adnexal mass. Left ovary Measurements: 3.4 x 2.1 x 2.0 cm. Normal appearance/no adnexal mass. Pulsed Doppler evaluation of both ovaries demonstrates normal low-resistance arterial and venous waveforms. Other findings No abnormal free fluid. IMPRESSION: Normal uterus and ovaries. Doppler confirms intact perfusion of both ovaries. Electronically Signed   By: Ellery Plunk M.D.   On: 07/11/2016 01:20    Procedures Procedures (including critical care time)  DIAGNOSTIC STUDIES: Oxygen Saturation is 100% on RA, normal by my interpretation.    COORDINATION OF CARE: 10:36 PM Discussed treatment plan with pt at bedside and pt agreed to plan.  Medications Ordered in ED Medications  cefTRIAXone (ROCEPHIN) injection 250 mg (250 mg Intramuscular Given 07/11/16 0152)  azithromycin (ZITHROMAX) tablet 1,000 mg (1,000 mg Oral Given 07/11/16 0153)  metroNIDAZOLE (FLAGYL) tablet 2,000 mg (2,000 mg Oral Given 07/11/16 0153)  lidocaine (XYLOCAINE) 1 % (with pres) injection (0.9 mLs  Given 07/11/16 0153)     Initial Impression / Assessment and Plan / ED Course  I have reviewed the triage vital signs and the nursing notes.  Pertinent labs & imaging results that were available during my care of the patient were reviewed by me and considered in my medical decision making (see chart for details).    Wendy Pitts is a 21 y.o. female who presents to ED for 2 complaints:  1. Left  lower quadrant pain associated with vaginal discharge. Patient has left adnexal tenderness on pelvic examination. Ultrasound was obtained which was unremarkable. UA does not appear infectious. Wet prep obtained which was positive for clue cells, Trichomonas, and moderate WBCs. Patient treated prophylactically for gonorrhea and chlamydia with azithromycin and Rocephin. She is aware that gonorrhea, chlamydia, HIV and RPR were obtained and she will be notified if results are positive. Trichomonas treated with Flagyl. No intercourse 2 weeks and until all symptoms are resolved. Informed to notify all partners.  2. Bilateral breast tenderness and discharge 1 month. Urine pregnancy test negative. Exam with no nodules or masses appreciated. No nipple deviation, erythema or skin changes. I was unable to express any discharge during my exam.  Discussed the importance of OB/GYN follow-up for both #1 and #2. Patient does have family history of breast cancer, although her exam is reassuring today, I did strongly encourage her to see OB/GYN and discuss breast discharge with them. Reasons to return to the ED were discussed and all questions answered.  Final Clinical Impressions(s) / ED Diagnoses   Final diagnoses:  Breast discharge  Trichomonosis    New Prescriptions Discharge Medication List as of 07/11/2016  2:00 AM     I personally performed  the services described in this documentation, which was scribed in my presence. The recorded information has been reviewed and is accurate.    [facility], PA-C 07/11/16 1610    Paula Libra, MD 07/11/16 [phone]

## 2016-07-10 NOTE — ED Triage Notes (Signed)
Pt c/o clear/milky breast discharge from both breasts with pain for one month. Pt c/o left sided pelvic pain with intermittent dysuria and brown discharge for past week.

## 2016-07-10 NOTE — ED Notes (Addendum)
Pt c/o 8/10 pain in both breasts with milky discharge from right breast, brownish vaginal discharge, lower left abd pain and intermittent dysuria.

## 2016-07-11 LAB — GC/CHLAMYDIA PROBE AMP (~~LOC~~) NOT AT ARMC
Chlamydia: NEGATIVE
Neisseria Gonorrhea: NEGATIVE

## 2016-07-11 LAB — HIV ANTIBODY (ROUTINE TESTING W REFLEX): HIV Screen 4th Generation wRfx: NONREACTIVE

## 2016-07-11 LAB — RPR: RPR Ser Ql: NONREACTIVE

## 2016-07-11 MED ORDER — CEFTRIAXONE SODIUM 250 MG IJ SOLR
250.0000 mg | Freq: Once | INTRAMUSCULAR | Status: AC
  Administered 2016-07-11: 250 mg via INTRAMUSCULAR
  Filled 2016-07-11: qty 250

## 2016-07-11 MED ORDER — LIDOCAINE HCL 1 % IJ SOLN
INTRAMUSCULAR | Status: AC
  Administered 2016-07-11: 0.9 mL
  Filled 2016-07-11: qty 20

## 2016-07-11 MED ORDER — AZITHROMYCIN 250 MG PO TABS
1000.0000 mg | ORAL_TABLET | Freq: Once | ORAL | Status: AC
  Administered 2016-07-11: 1000 mg via ORAL
  Filled 2016-07-11: qty 4

## 2016-07-11 MED ORDER — METRONIDAZOLE 500 MG PO TABS
2000.0000 mg | ORAL_TABLET | Freq: Once | ORAL | Status: AC
  Administered 2016-07-11: 2000 mg via ORAL
  Filled 2016-07-11: qty 4

## 2016-07-11 NOTE — Discharge Instructions (Signed)
It is very important that you follow-up with the OB/GYN. Please call them first thing in the morning. No sexual intercourse for 2 weeks and resolution of symptoms. Notify partner of results. Use a condom with every sexual encounter Please return to the ER for worsening symptoms, high fevers or persistent vomiting.  You have been tested for HIV, syphilis, chlamydia and gonorrhea. These results will be available in approximately 3 days. You will be notified if they are positive.   It is very important to practice safe sex and use condoms when sexually active. If your results are positive you need to notify all sexual partners so they can be treated as well. The website [email or web address].net/ can be used to send anonymous text messages or emails to alert sexual contacts.

## 2016-11-17 ENCOUNTER — Encounter (HOSPITAL_COMMUNITY): Payer: Self-pay

## 2016-11-17 ENCOUNTER — Emergency Department (HOSPITAL_COMMUNITY): Payer: BLUE CROSS/BLUE SHIELD

## 2016-11-17 ENCOUNTER — Emergency Department (HOSPITAL_COMMUNITY)
Admission: EM | Admit: 2016-11-17 | Discharge: 2016-11-17 | Disposition: A | Discharge status: Home / Self Care | Payer: BLUE CROSS/BLUE SHIELD | Attending: Emergency Medicine | Admitting: Emergency Medicine

## 2016-11-17 DIAGNOSIS — R55 Syncope and collapse: Secondary | ICD-10-CM

## 2016-11-17 DIAGNOSIS — Y939 Activity, unspecified: Secondary | ICD-10-CM | POA: Diagnosis not present

## 2016-11-17 DIAGNOSIS — Y999 Unspecified external cause status: Secondary | ICD-10-CM | POA: Diagnosis not present

## 2016-11-17 DIAGNOSIS — M791 Myalgia: Secondary | ICD-10-CM | POA: Diagnosis present

## 2016-11-17 DIAGNOSIS — W01198A Fall on same level from slipping, tripping and stumbling with subsequent striking against other object, initial encounter: Secondary | ICD-10-CM | POA: Insufficient documentation

## 2016-11-17 DIAGNOSIS — Y92002 Bathroom of unspecified non-institutional (private) residence single-family (private) house as the place of occurrence of the external cause: Secondary | ICD-10-CM | POA: Diagnosis not present

## 2016-11-17 DIAGNOSIS — S20212A Contusion of left front wall of thorax, initial encounter: Secondary | ICD-10-CM | POA: Diagnosis not present

## 2016-11-17 LAB — URINALYSIS, ROUTINE W REFLEX MICROSCOPIC
BILIRUBIN URINE: NEGATIVE
Glucose, UA: NEGATIVE mg/dL
HGB URINE DIPSTICK: NEGATIVE
Ketones, ur: NEGATIVE mg/dL
NITRITE: NEGATIVE
PROTEIN: NEGATIVE mg/dL
SPECIFIC GRAVITY, URINE: 1.023 (ref 1.005–1.030)
pH: 6 (ref 5.0–8.0)

## 2016-11-17 LAB — CBC
HEMATOCRIT: 39.5 % (ref 36.0–46.0)
HEMOGLOBIN: 13 g/dL (ref 12.0–15.0)
MCH: 31 pg (ref 26.0–34.0)
MCHC: 32.9 g/dL (ref 30.0–36.0)
MCV: 94 fL (ref 78.0–100.0)
Platelets: 244 10*3/uL (ref 150–400)
RBC: 4.2 MIL/uL (ref 3.87–5.11)
RDW: 12.9 % (ref 11.5–15.5)
WBC: 7 10*3/uL (ref 4.0–10.5)

## 2016-11-17 LAB — BASIC METABOLIC PANEL
ANION GAP: 5 (ref 5–15)
BUN: 10 mg/dL (ref 6–20)
CO2: 24 mmol/L (ref 22–32)
Calcium: 9.1 mg/dL (ref 8.9–10.3)
Chloride: 108 mmol/L (ref 101–111)
Creatinine, Ser: 0.87 mg/dL (ref 0.44–1.00)
GLUCOSE: 72 mg/dL (ref 65–99)
POTASSIUM: 4.4 mmol/L (ref 3.5–5.1)
Sodium: 137 mmol/L (ref 135–145)

## 2016-11-17 LAB — CBG MONITORING, ED: GLUCOSE-CAPILLARY: 57 mg/dL — AB (ref 65–99)

## 2016-11-17 NOTE — ED Provider Notes (Signed)
[facility]-EMERGENCY DEPT Provider Note   CSN: [id number] Arrival date & time: 11/17/16  1221     History   Chief Complaint Chief Complaint  Patient presents with  . Muscle Pain  . Loss of Consciousness    HPI Wendy Pitts is a 21 y.o. female.  Patient is a 21 year old female with no significant past medical history presenting for evaluation of syncope/left rib injury. 2 days ago she reports walking to the bathroom, then feeling lightheaded and passed out striking her ribs on the side of the tub. She has had pain in her left ribs that is worse with moving and breathing since. She denies any shortness of breath.This episode lasted for several seconds, then resolved. There is no reported seizure activity. There was no incontinence of bowel or bladder. She has been feeling well since with the exception of pain in her ribs.   The history is provided by the patient.  Muscle Pain  This is a new problem. The current episode started 2 days ago. The problem occurs constantly. The problem has not changed since onset.Associated symptoms include chest pain. Nothing aggravates the symptoms. Nothing relieves the symptoms. She has tried nothing for the symptoms. The treatment provided no relief.  Loss of Consciousness   Associated symptoms include chest pain.    History reviewed. No pertinent past medical history.  There are no active problems to display for this patient.   History reviewed. No pertinent surgical history.  OB History    No data available       Home Medications    Prior to Admission medications   Medication Sig Start Date End Date Taking? Authorizing Provider  ciprofloxacin (CIPRO) 500 MG tablet Take 1 tablet (500 mg total) by mouth 2 (two) times daily. One po bid x 7 days Patient not taking: Reported on 07/10/2016 05/07/16   Charlynne PanderYao, David Hsienta, MD  ondansetron [facility](ZOFRAN ODT) 4 MG disintegrating tablet 4mg  ODT q6 hours prn nausea/vomit Patient not taking: Reported on 07/10/2016  05/07/16   Charlynne PanderYao, David Hsienta, MD    Family History No family history on file.  Social History Social History  Substance Use Topics  . Smoking status: Never Smoker  . Smokeless tobacco: Never Used  . Alcohol use No     Allergies   Patient has no known allergies.   Review of Systems Review of Systems  Cardiovascular: Positive for chest pain and syncope.     Physical Exam Updated Vital Signs BP (!) 102/48 (BP Location: Right Arm)   Pulse 76   Temp 98.6 F (37 C) (Oral)   Resp 18   Ht 5\' 4"  (1.626 m)   Wt 113.4 kg (250 lb)   LMP 10/12/2016   SpO2 97%   BMI 42.91 kg/m   Physical Exam   ED Treatments / Results  Labs (all labs ordered are listed, but only abnormal results are displayed) Labs Reviewed  URINALYSIS, ROUTINE W REFLEX MICROSCOPIC - Abnormal; Notable for the following:       Result Value   APPearance HAZY (*)    Leukocytes, UA TRACE (*)    Bacteria, UA RARE (*)    Squamous Epithelial / LPF 6-30 (*)    All other components within normal limits  CBG MONITORING, ED - Abnormal; Notable for the following:    Glucose-Capillary 57 (*)    All other components within normal limits  BASIC METABOLIC PANEL  CBC    EKG  EKG Interpretation  Date/Time:  Monday November 17 2016  13:24:27 EDT Ventricular Rate:  78 PR Interval:  144 QRS Duration: 76 QT Interval:  348 QTC Calculation: 396 R Axis:   83 Text Interpretation:  Normal sinus rhythm with sinus arrhythmia Otherwise normal ECG Confirmed by Geoffery Lyons [phone]) on 11/17/2016 2:33:40 PM       Radiology Dg Chest 2 View  Result Date: 11/17/2016 CLINICAL DATA:  Chest pain.  Recent fall EXAM: CHEST  2 VIEW COMPARISON:  May 07, 2016 FINDINGS: There is no edema or consolidation. The heart size and pulmonary vascularity are normal. No adenopathy. No pneumothorax. No bone lesions. IMPRESSION: No edema or consolidation. Electronically Signed   By: Bretta Bang III M.D.   On: 11/17/2016 13:53     Procedures Procedures (including critical care time)  Medications Ordered in ED Medications - No data to display   Initial Impression / Assessment and Plan / ED Course  I have reviewed the triage vital signs and the nursing notes.  Pertinent labs & imaging results that were available during my care of the patient were reviewed by me and considered in my medical decision making (see chart for details).  Patient presents after a syncopal episode that sounds most likely vasovagal in nature. This caused her to fall and injure her ribs. There is no evidence of PTX or rib fracture. Laboratory studies are normal and physical examination is unremarkable. I see no indication for further workup at this time. She will be discharged and is to follow-up with her primary Dr. If symptoms recur.   Final Clinical Impressions(s) / ED Diagnoses   Final diagnoses:  None    New Prescriptions New Prescriptions   No medications on file     Geoffery Lyons, MD 11/17/16 1437

## 2016-11-17 NOTE — Discharge Instructions (Signed)
Ibuprofen 600 mg every 6 hours as needed for pain.  Drink plenty of fluids and get plenty of rest.  Follow up with your primary Dr. If symptoms continue, and return to the ER if symptoms significantly worsen or change.

## 2016-11-17 NOTE — ED Triage Notes (Signed)
PT reports syncopal episode several days ago that caused her to fall onto tub and hit left side of ribs. Pt endorses pain in left ribcage that is worse with deep breaths. Pt reports syncopal episodes in the past related to dehydration and felt the same with this episode

## 2018-07-20 IMAGING — CT CT NECK W/O CM
4 of 6 series · 14 of 33 positions shown, 16 images · non-contrast
Comparison: None.

CLINICAL DATA: Anterior neck swelling and fever.

EXAM:
CT NECK WITHOUT CONTRAST
TECHNIQUE: Multidetector CT imaging of the neck was performed following the
standard protocol without intravenous contrast.

[Series 2: neckstw/cm · axial · 0.49mm/px · z∈[+736,+778]mm · 2 of 105 slices shown]
[im 21/105  bone]
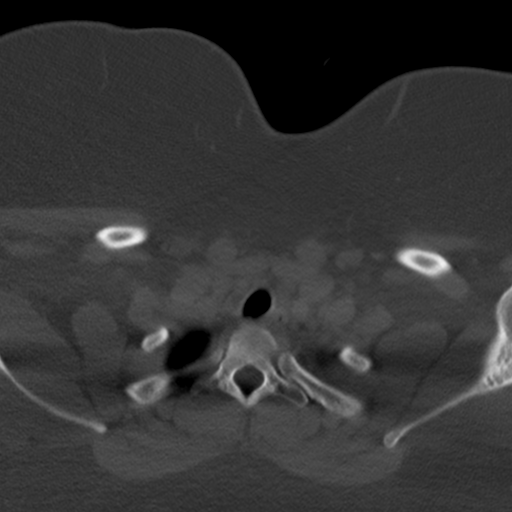
[im 42/105  bone]
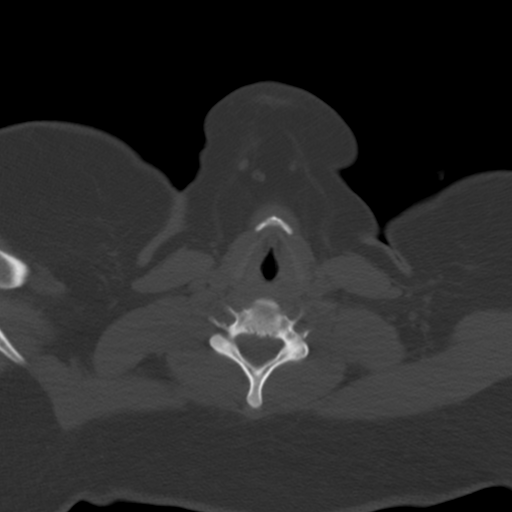

[Series 6: coronal st · coronal · 0.37mm/px · 3 of 107 slices shown]
[im 37/107  bone]
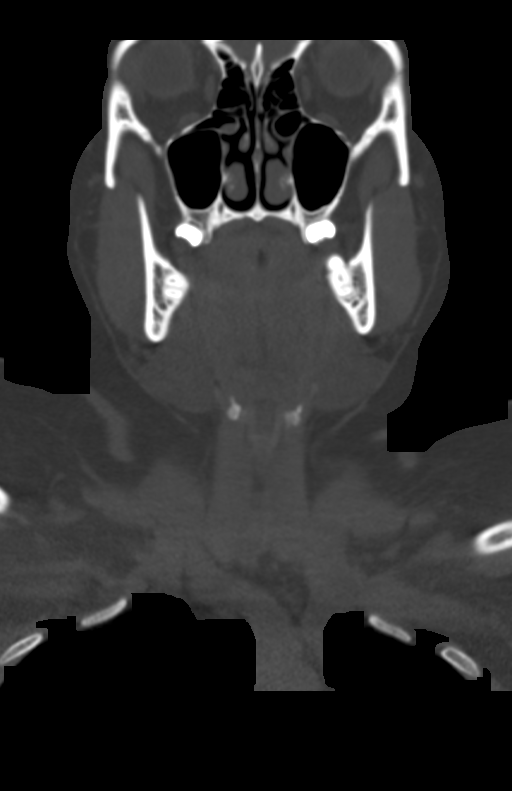
[im 48/107  bone]
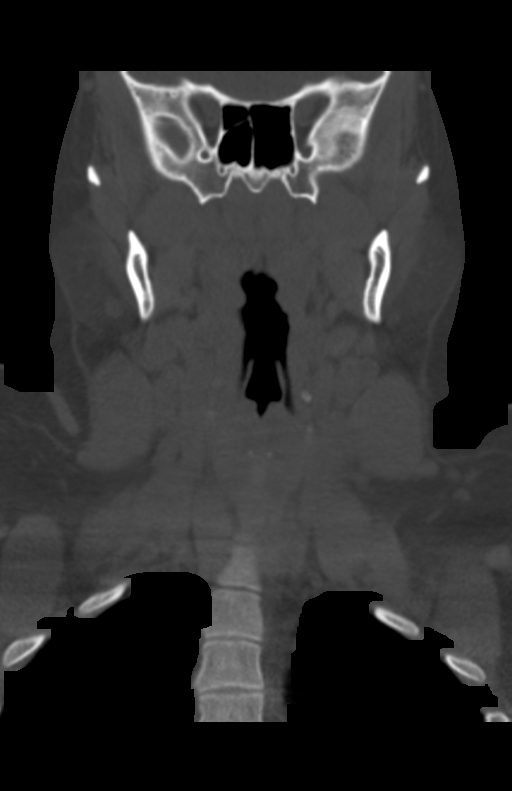
[im 59/107  bone]
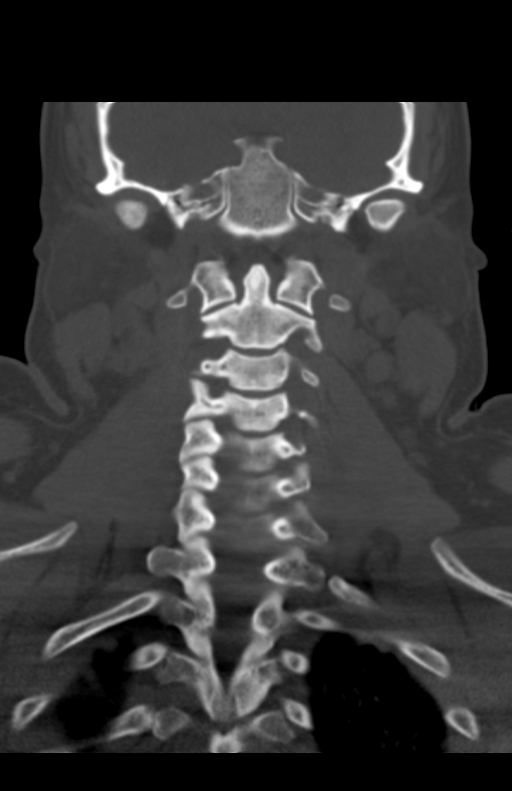

[Series 7: sagittal st · sagittal · 0.42mm/px · 5 of 101 slices shown, 6 images]
[im 34/101  bone]
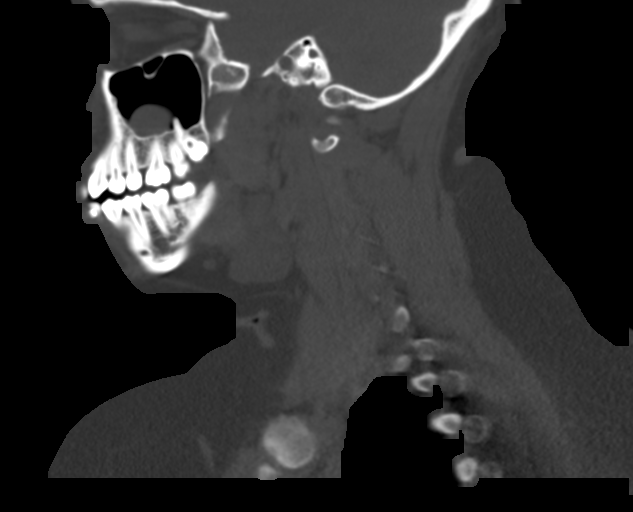
[im 42/101  bone]
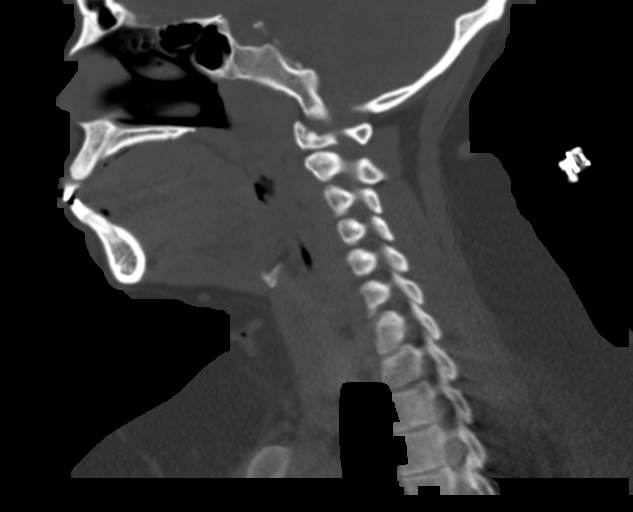
[im 51/101  soft-tissue]
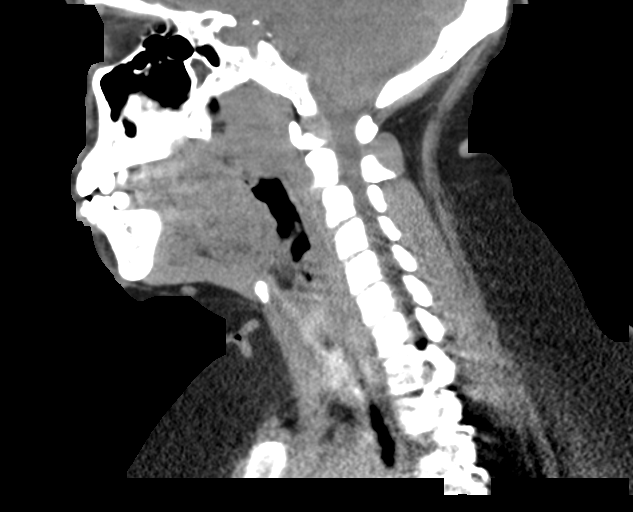
[im 51/101  bone]
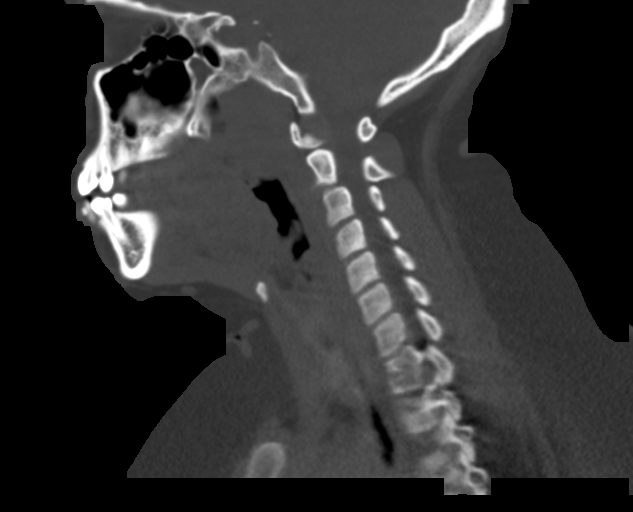
[im 59/101  bone]
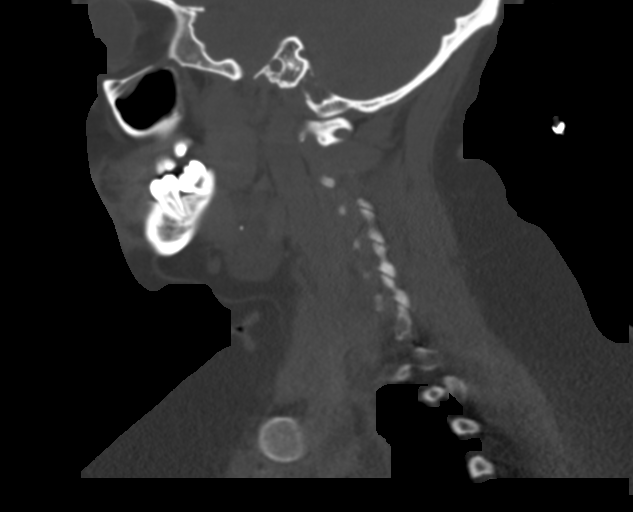
[im 67/101  bone]
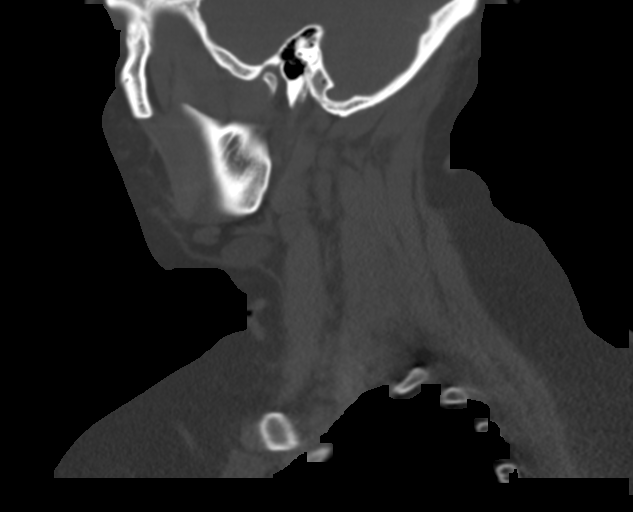

[Series 8: ax axial recons · axial · 0.39mm/px · z∈[+687,+819]mm · 4 of 123 slices shown, 5 images]
[im 25/123  soft-tissue]
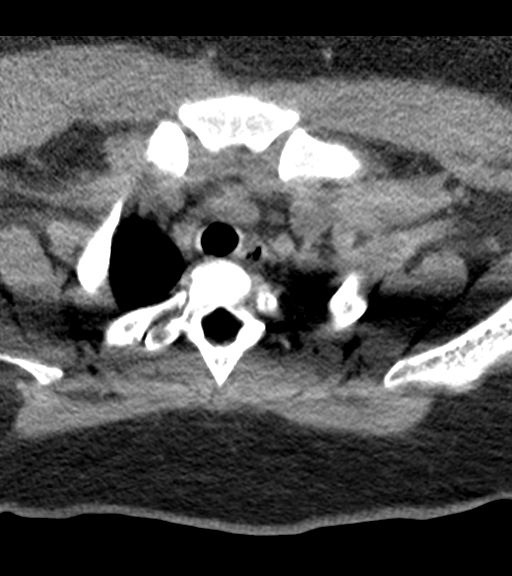
[im 25/123  bone]
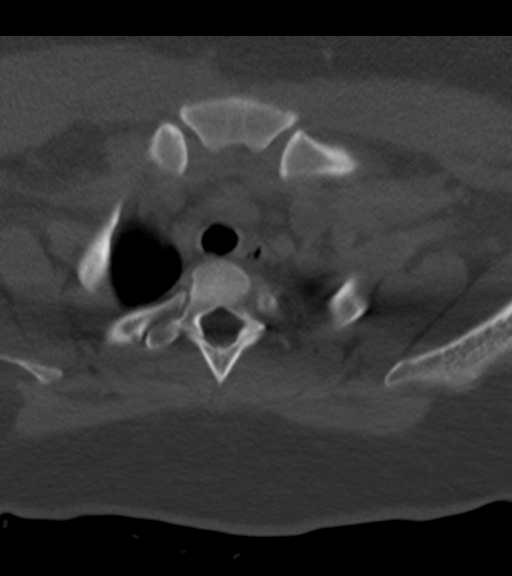
[im 49/123  bone]
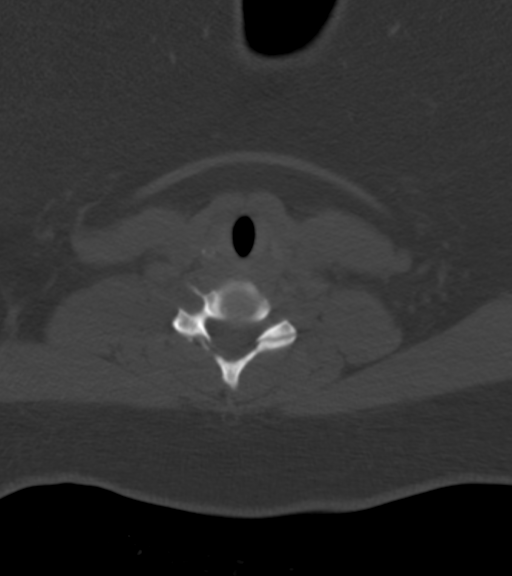
[im 74/123  bone]
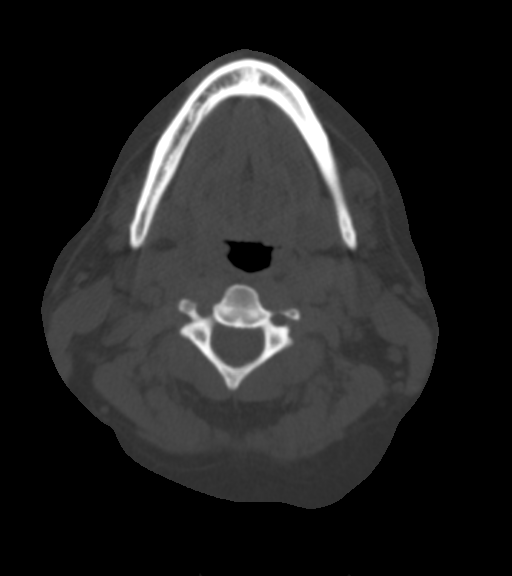
[im 98/123  bone]
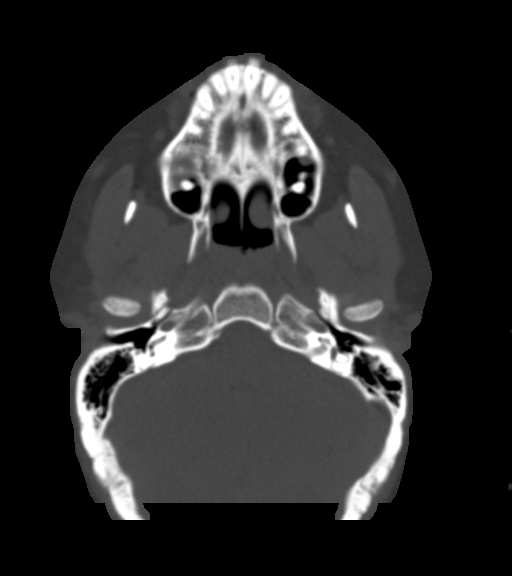

[14 of 33 positions shown; findings below may reference images not displayed]

FINDINGS: Limited sensitivity due to noncontrast technique.

Pharynx and larynx: Symmetric thickening of the adenoid tonsils.
Mild enlargement of the palatini tonsils. No detected edema or mass.
Widely patent airway.

Salivary glands: Negative

Thyroid: Negative

Lymph nodes: Symmetric enlargement of bilateral cervical lymph nodes
which retain a ovoid shape. No abnormal lymph node density.

Vascular: Negative noncontrast appearance

Limited intracranial: Negative

Visualized orbits: Possible dysconjugate gaze, nonspecific.

Mastoids and visualized paranasal sinuses: Mucous retention cysts in
the right maxillary sinus.

Skeleton: Negative.

Upper chest: Negative
IMPRESSION: Adenoid thickening and enlargement of bilateral cervical lymph
nodes. In the setting of fever these changes are presumably
infectious/reactive. Recommend clinical followup to normalization.

## 2018-10-15 IMAGING — US US ART/VEN ABD/PELV/SCROTUM DOPPLER LTD
1 series · 14 of 25 positions shown · non-contrast
Comparison: None.

CLINICAL DATA: Left lower quadrant pain

EXAM:
TRANSABDOMINAL AND TRANSVAGINAL ULTRASOUND OF PELVIS
DOPPLER ULTRASOUND OF OVARIES
TECHNIQUE: Both transabdominal and transvaginal ultrasound examinations of the
pelvis were performed. Transabdominal technique was performed for
global imaging of the pelvis including uterus, ovaries, adnexal
regions, and pelvic cul-de-sac.
It was necessary to proceed with endovaginal exam following the
transabdominal exam to visualize the ovaries. Color and duplex
Doppler ultrasound was utilized to evaluate blood flow to the
ovaries.

[Series 1: us art/ven abd/pelv/scrotum doppler ltd · 0.27mm/px · 14 of 77 slices shown]
[im 1/77]
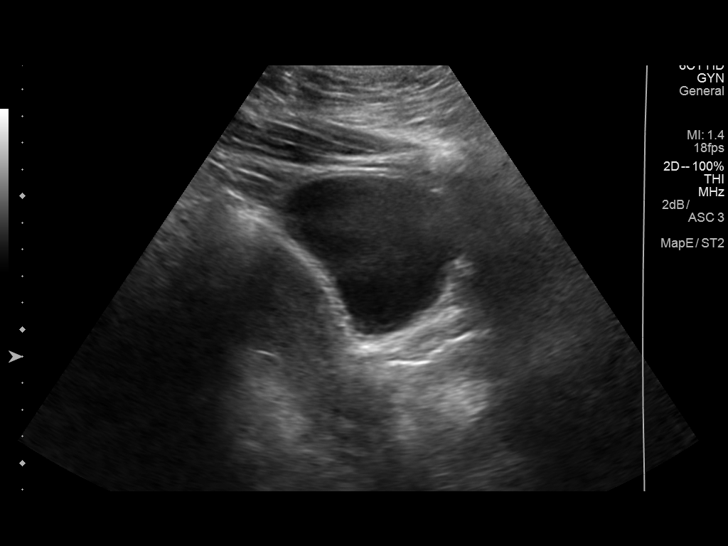
[im 7/77]
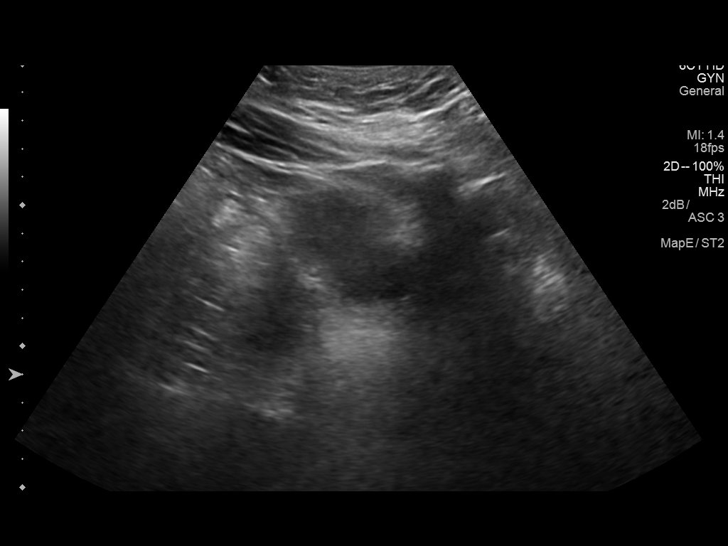
[im 13/77]
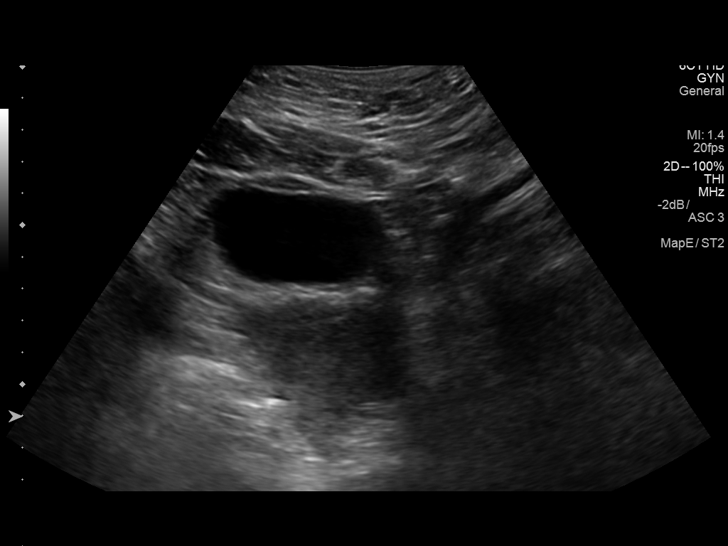
[im 20/77]
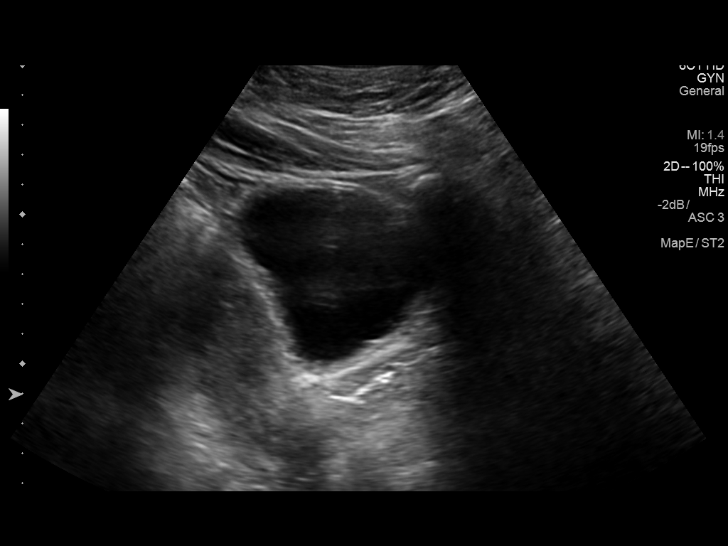
[im 26/77]
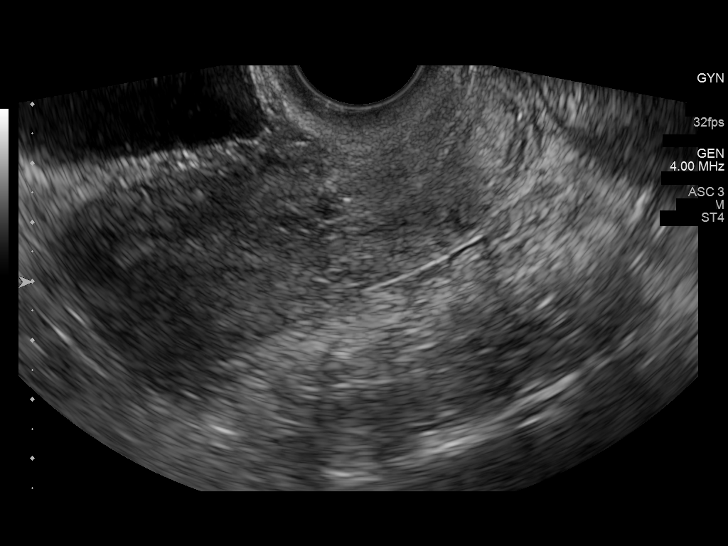
[im 29/77]
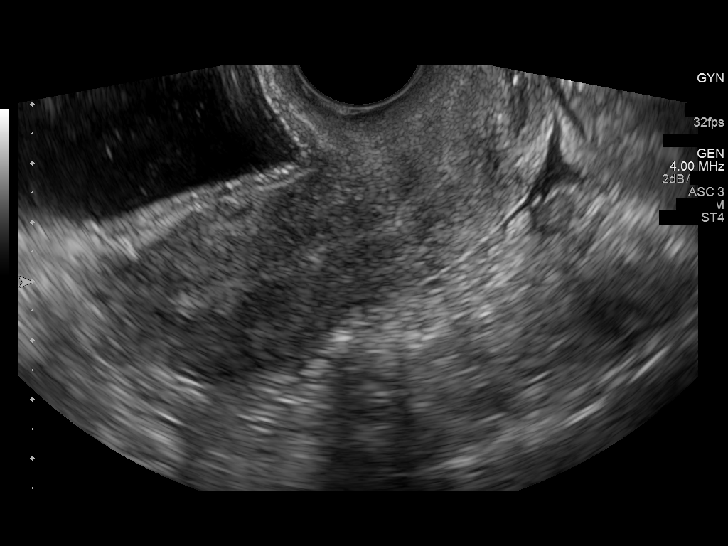
[im 35/77]
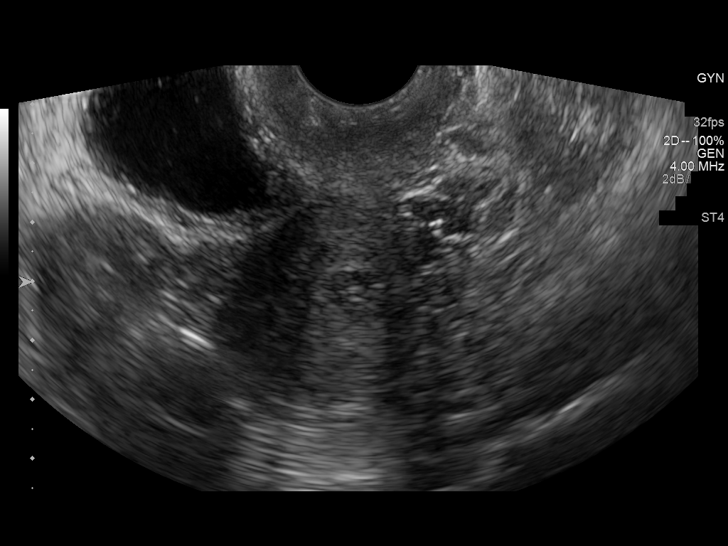
[im 42/77]
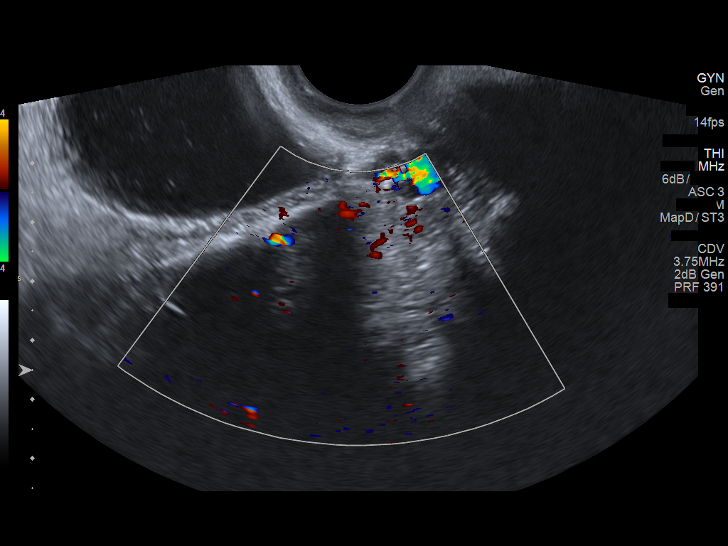
[im 48/77]
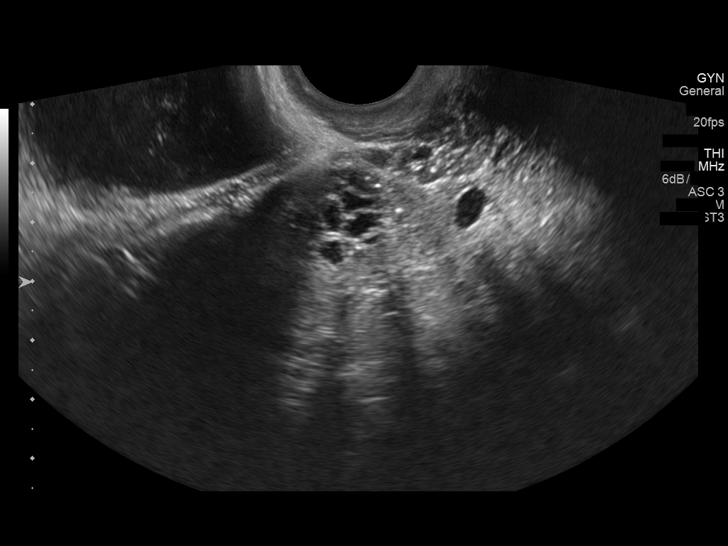
[im 51/77]
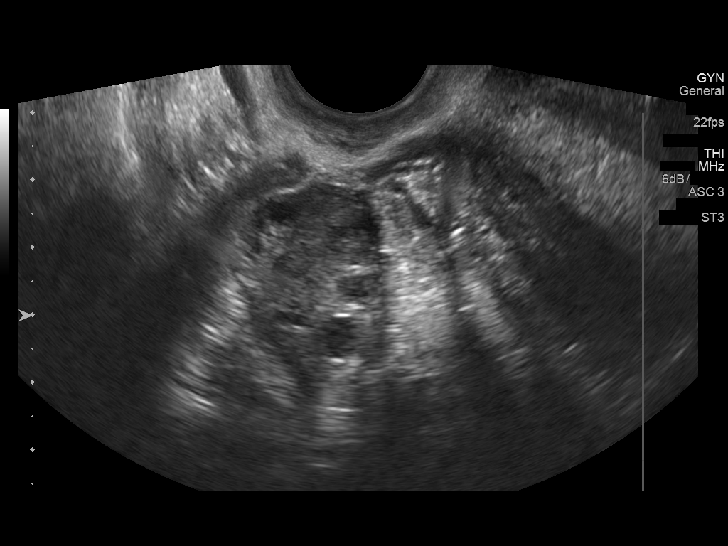
[im 58/77]
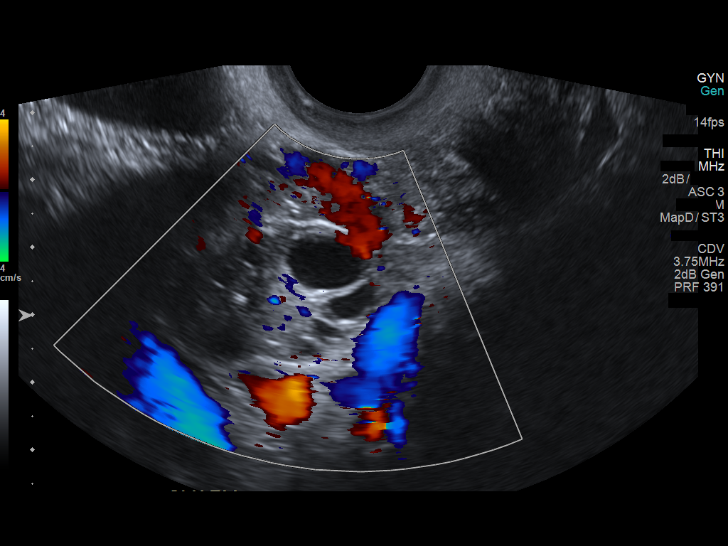
[im 64/77]
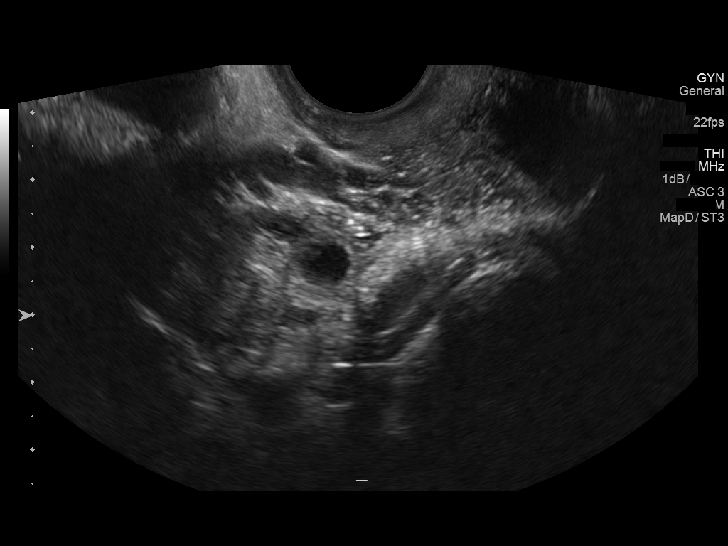
[im 70/77]
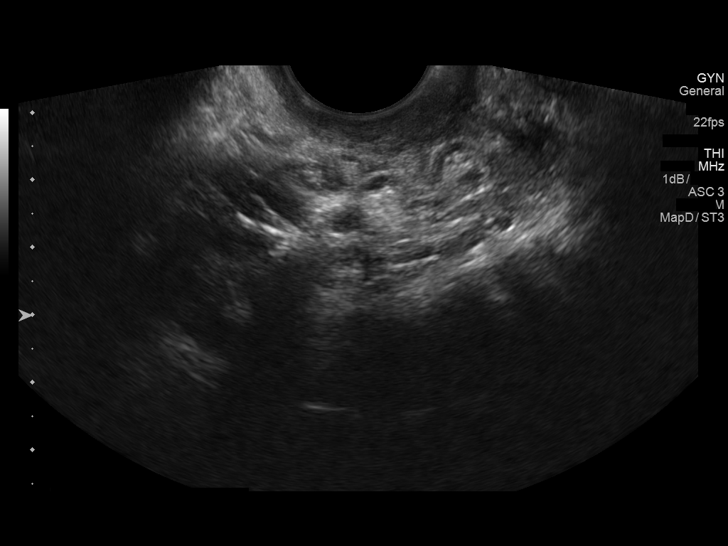
[im 77/77]
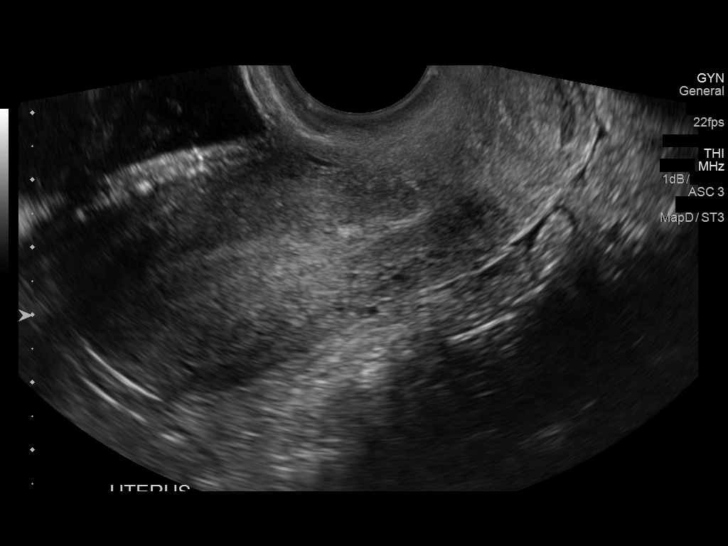

[14 of 25 positions shown; findings below may reference images not displayed]

FINDINGS: Uterus

Measurements: 7.8 x 3.4 x 3.6 cm. No fibroids or other mass
visualized.

Endometrium

Thickness: 5.5 mm.  No focal abnormality visualized.

Right ovary

Measurements: 3.3 x 2.4 x 2.3 cm. Normal appearance/no adnexal mass.

Left ovary

Measurements: 3.4 x 2.1 x 2.0 cm. Normal appearance/no adnexal mass.

Pulsed Doppler evaluation of both ovaries demonstrates normal
low-resistance arterial and venous waveforms.

Other findings

No abnormal free fluid.
IMPRESSION: Normal uterus and ovaries. Doppler confirms intact perfusion of both
ovaries.

## 2021-01-28 ENCOUNTER — Ambulatory Visit (HOSPITAL_COMMUNITY)
Admission: EM | Admit: 2021-01-28 | Discharge: 2021-01-28 | Disposition: A | Discharge status: Home / Self Care | Payer: 59 | Attending: Emergency Medicine | Admitting: Emergency Medicine

## 2021-01-28 ENCOUNTER — Encounter (HOSPITAL_COMMUNITY): Payer: Self-pay

## 2021-01-28 ENCOUNTER — Other Ambulatory Visit: Payer: Self-pay

## 2021-01-28 DIAGNOSIS — N898 Other specified noninflammatory disorders of vagina: Secondary | ICD-10-CM | POA: Insufficient documentation

## 2021-01-28 HISTORY — DX: Anxiety disorder, unspecified: F41.9

## 2021-01-28 LAB — POCT URINALYSIS DIPSTICK, ED / UC
Bilirubin Urine: NEGATIVE
Glucose, UA: NEGATIVE mg/dL
Nitrite: NEGATIVE
Protein, ur: NEGATIVE mg/dL
Specific Gravity, Urine: 1.02 (ref 1.005–1.030)
Urobilinogen, UA: 0.2 mg/dL (ref 0.0–1.0)
pH: 6.5 (ref 5.0–8.0)

## 2021-01-28 MED ORDER — FLUCONAZOLE 200 MG PO TABS: 200.0000 mg | ORAL_TABLET | Freq: Every day | ORAL | 0 refills | Status: AC

## 2021-01-28 MED ORDER — METRONIDAZOLE 500 MG PO TABS: 500.0000 mg | ORAL_TABLET | Freq: Two times a day (BID) | ORAL | 0 refills | Status: DC

## 2021-01-28 NOTE — ED Triage Notes (Signed)
Pt reports vaginal discharge (white mucousy), itching, dysuria starting 9 days ago. Also had unprotected intercourse with a new partner.    Reports spitting up bright green nasal discharge x 1 mo.    Pt reports periodic suicidal and self harm thoughts.  No plan.  Pt was on anxiety/depression meds until moving 2 mos ago - has not been able to find a PCP.

## 2021-01-28 NOTE — Discharge Instructions (Addendum)
Your urinalysis today show Detavious Rinn blood cells but no bacteria, it has been sent to the lab to see if it grows bacteria, if this occurs then you will be notified and antibiotics sent.  Today you will be treated prophylactically for bacterial vaginosis  Take metronidazole twice a day for the next 7 days, please do not drink while using this medicine as it will make you very sick  You may use Diflucan if yeast infection occurs due to antibiotic use, take 1 pill It may take second dose then in 72 hours if needed  Labs pending 2-3 days, you will be contacted if positive for any sti and treatment will be sent to the pharmacy, you will have to return to the clinic if positive for gonorrhea to receive treatment   Please refrain from having sex until labs results, if positive please refrain from having sex until treatment complete and symptoms resolve   If positive for Chlamydia  gonorrhea or trichomoniasis please notify partner or partners so they may tested as well  Moving forward, it is recommended you use some form of protection against the transmission of sti infections  such as condoms or dental dams with each sexual encounter

## 2021-01-29 LAB — CERVICOVAGINAL ANCILLARY ONLY
Bacterial Vaginitis (gardnerella): POSITIVE — AB
Candida Glabrata: NEGATIVE
Candida Vaginitis: NEGATIVE
Chlamydia: NEGATIVE
Comment: NEGATIVE
Comment: NEGATIVE
Comment: NEGATIVE
Comment: NEGATIVE
Comment: NEGATIVE
Comment: NORMAL
Neisseria Gonorrhea: NEGATIVE
Trichomonas: POSITIVE — AB

## 2021-01-29 NOTE — ED Provider Notes (Addendum)
[facility]    CSN: [id number] Arrival date & time: 01/28/21  1749      History   Chief Complaint Chief Complaint  Patient presents with   Dysuria    HPI Wendy Pitts is a 25 y.o. female.   Patient presents with Yazleen Molock thick discharge, itching, mild odor, dysuria and urinary frequency for 9 days beginning after a unprotected sexual encounter with new partner.  Has not attempted treatment of symptoms.  Denies urinary urgency, abdominal pain, flank pain, hematuria.  During triage patient endorses that she has had thoughts of suicidal ideation with no plan.  History of anxiety and depression and was on medication up until 2 months ago when she moved.  Has been having difficulty finding PCP.   Past Medical History:  Diagnosis Date   Anxiety     There are no problems to display for this patient.   History reviewed. No pertinent surgical history.  OB History   No obstetric history on file.      Home Medications    Prior to Admission medications   Medication Sig Start Date End Date Taking? Authorizing Provider  fluconazole (DIFLUCAN) 200 MG tablet Take 1 tablet (200 mg total) by mouth daily for 7 days. 01/28/21 02/04/21 Yes Ashawn Rinehart R, NP  metroNIDAZOLE (FLAGYL) 500 MG tablet Take 1 tablet (500 mg total) by mouth 2 (two) times daily. 01/28/21  Yes Sehaj Kolden, Elita Boone, NP  UNABLE TO FIND Med Name: Arlyss Queen - for anxiety/depression   Yes [provider]    Family History History reviewed. No pertinent family history.  Social History Social History   Tobacco Use   Smoking status: Never   Smokeless tobacco: Never  Vaping Use   Vaping Use: Never used  Substance Use Topics   Alcohol use: No   Drug use: No     Allergies   Patient has no known allergies.   Review of Systems Review of Systems  Respiratory: Negative.    Cardiovascular: Negative.   Gastrointestinal: Negative.   Genitourinary:  Positive for dysuria, frequency and  vaginal discharge. Negative for decreased urine volume, difficulty urinating, dyspareunia, enuresis, flank pain, genital sores, hematuria, menstrual problem, pelvic pain, urgency, vaginal bleeding and vaginal pain.  Skin: Negative.   Psychiatric/Behavioral:  Positive for suicidal ideas. Negative for agitation, behavioral problems, confusion, decreased concentration, dysphoric mood, hallucinations, self-injury and sleep disturbance. The patient is not nervous/anxious and is not hyperactive.     Physical Exam Triage Vital Signs ED Triage Vitals  Enc Vitals Group     BP 01/28/21 1959 124/86     Pulse Rate 01/28/21 1931 85     Resp 01/28/21 1931 18     Temp 01/28/21 1931 98 F (36.7 C)     Temp Source 01/28/21 1931 Oral     SpO2 01/28/21 1931 100 %     Weight --      Height --      Head Circumference --      Peak Flow --      Pain Score 01/28/21 1922 0     Pain Loc --      Pain Edu? --      Excl. in GC? --    No data found.  Updated Vital Signs BP 124/86 (BP Location: Left Wrist)   Pulse 85   Temp 98 F (36.7 C) (Oral)   Resp 18   LMP 01/28/2021   SpO2 100%   Visual Acuity Right Eye Distance:  Left Eye Distance:   Bilateral Distance:    Right Eye Near:   Left Eye Near:    Bilateral Near:     Physical Exam Constitutional:      Appearance: Normal appearance.  HENT:     Head: Normocephalic.  Eyes:     Extraocular Movements: Extraocular movements intact.  Pulmonary:     Effort: Pulmonary effort is normal.  Genitourinary:    Comments: Deferred self collect vaginal swab Skin:    General: Skin is warm and dry.  Neurological:     Mental Status: She is alert and oriented to person, place, and time. Mental status is at baseline.  Psychiatric:        Mood and Affect: Mood normal.        Behavior: Behavior normal.     UC Treatments / Results  Labs (all labs ordered are listed, but only abnormal results are displayed) Labs Reviewed  POCT URINALYSIS DIPSTICK, ED /  UC - Abnormal; Notable for the following components:      Result Value   Ketones, ur TRACE (*)    Hgb urine dipstick LARGE (*)    Leukocytes,Ua TRACE (*)    All other components within normal limits  URINE CULTURE  CERVICOVAGINAL ANCILLARY ONLY    EKG   Radiology No results found.  Procedures Procedures (including critical care time)  Medications Ordered in UC Medications - No data to display  Initial Impression / Assessment and Plan / UC Course  I have reviewed the triage vital signs and the nursing notes.  Pertinent labs & imaging results that were available during my care of the patient were reviewed by me and considered in my medical decision making (see chart for details).  Discharge  1.  Urinalysis showing Rivaldo Hineman blood cells and hemoglobin, patient is currently on period, sent for culture 2.  STI screening pending, will treat per protocol, advised abstinence until lab results and/or treatment, advised condom use moving foward 3.will treat prophylactically for BV BV based on symptomology, metronidazole 500 mg twice daily for 7 days, advised cessation of alcohol while on medication 4.  Diflucan 150 mg tablets 150 mg in 72 hours as needed, patient endorses that she frequently gets yeast infections while on antibiotics 5.  Patient given behavioral health resource to help with care, notified to go to the emergency department if suicidal ideation increased Final Clinical Impressions(s) / UC Diagnoses   Final diagnoses:  Vaginal discharge     Discharge Instructions      Your urinalysis today show Holley Wirt blood cells but no bacteria, it has been sent to the lab to see if it grows bacteria, if this occurs then you will be notified and antibiotics sent.  Today you will be treated prophylactically for bacterial vaginosis  Take metronidazole twice a day for the next 7 days, please do not drink while using this medicine as it will make you very sick  You may use Diflucan if  yeast infection occurs due to antibiotic use, take 1 pill It may take second dose then in 72 hours if needed  Labs pending 2-3 days, you will be contacted if positive for any sti and treatment will be sent to the pharmacy, you will have to return to the clinic if positive for gonorrhea to receive treatment   Please refrain from having sex until labs results, if positive please refrain from having sex until treatment complete and symptoms resolve   If positive for Chlamydia  gonorrhea or trichomoniasis  please notify partner or partners so they may tested as well  Moving forward, it is recommended you use some form of protection against the transmission of sti infections  such as condoms or dental dams with each sexual encounter     ED Prescriptions     Medication Sig Dispense Auth. Provider   metroNIDAZOLE (FLAGYL) 500 MG tablet Take 1 tablet (500 mg total) by mouth 2 (two) times daily. 14 tablet Sylis Ketchum R, NP   fluconazole (DIFLUCAN) 200 MG tablet Take 1 tablet (200 mg total) by mouth daily for 7 days. 7 tablet Wallice Granville, Elita Boone, NP      PDMP not reviewed this encounter.   Valinda Hoar, NP 01/29/21 0840    Valinda Hoar, NP 01/29/21 [phone]

## 2021-03-13 ENCOUNTER — Other Ambulatory Visit: Payer: Self-pay

## 2021-04-24 ENCOUNTER — Ambulatory Visit (HOSPITAL_COMMUNITY)
Admission: EM | Admit: 2021-04-24 | Discharge: 2021-04-24 | Disposition: A | Discharge status: Home / Self Care | Payer: 59 | Attending: Emergency Medicine | Admitting: Emergency Medicine

## 2021-04-24 ENCOUNTER — Encounter (HOSPITAL_COMMUNITY): Payer: Self-pay

## 2021-04-24 ENCOUNTER — Other Ambulatory Visit: Payer: Self-pay

## 2021-04-24 DIAGNOSIS — H00022 Hordeolum internum right lower eyelid: Secondary | ICD-10-CM | POA: Diagnosis not present

## 2021-04-24 MED ORDER — POLYMYXIN B-TRIMETHOPRIM 10000-0.1 UNIT/ML-% OP SOLN: 1.0000 [drp] | OPHTHALMIC | 0 refills | Status: DC

## 2021-04-24 NOTE — ED Provider Notes (Signed)
[facility]    CSN: [id number] Arrival date & time: 04/24/21  1103      History   Chief Complaint Chief Complaint  Patient presents with   Eye Problem    HPI Wendy Pitts is a 26 y.o. female. Patient presents to Urgent Care with complaints of eye drainage, irritation, and burning to right eye lid. Pt states this has been an intermittent issue going on for 2 weeks.  She was able to resolve the problem last week with warm compresses but then it returned.    Eye Problem Associated symptoms: discharge   Associated symptoms: no redness    Past Medical History:  Diagnosis Date   Anxiety     There are no problems to display for this patient.   History reviewed. No pertinent surgical history.  OB History   No obstetric history on file.      Home Medications    Prior to Admission medications   Medication Sig Start Date End Date Taking? Authorizing Provider  trimethoprim-polymyxin b (POLYTRIM) ophthalmic solution Place 1 drop into the right eye every 4 (four) hours. 04/24/21  Yes Cathlyn Parsons, NP  metroNIDAZOLE (FLAGYL) 500 MG tablet Take 1 tablet (500 mg total) by mouth 2 (two) times daily. 01/28/21   White, Elita Boone, NP  UNABLE TO FIND Med Name: Arlyss Queen - for anxiety/depression    [provider]    Family History History reviewed. No pertinent family history.  Social History Social History   Tobacco Use   Smoking status: Never   Smokeless tobacco: Never  Vaping Use   Vaping Use: Never used  Substance Use Topics   Alcohol use: No   Drug use: No     Allergies   Patient has no known allergies.   Review of Systems Review of Systems  Constitutional:  Negative for chills and fever.  Eyes:  Positive for discharge. Negative for redness and visual disturbance.       Eyelid swelling    Physical Exam Triage Vital Signs ED Triage Vitals  Enc Vitals Group     BP 04/24/21 0856 113/79     Pulse Rate 04/24/21 0856 80     Resp  04/24/21 0856 16     Temp 04/24/21 0856 98.5 F (36.9 C)     Temp Source 04/24/21 0856 Oral     SpO2 04/24/21 0856 96 %     Weight --      Height --      Head Circumference --      Peak Flow --      Pain Score 04/24/21 0859 0     Pain Loc --      Pain Edu? --      Excl. in GC? --    No data found.  Updated Vital Signs BP 113/79 (BP Location: Right Arm)    Pulse 80    Temp 98.5 F (36.9 C) (Oral)    Resp 16    LMP 04/03/2021    SpO2 96%   Visual Acuity Right Eye Distance:   Left Eye Distance:   Bilateral Distance:    Right Eye Near:   Left Eye Near:    Bilateral Near:     Physical Exam Constitutional:      General: She is not in acute distress.    Appearance: Normal appearance. She is not ill-appearing.  Eyes:     General:        Right eye: Hordeolum present.  Conjunctiva/sclera:     Right eye: Right conjunctiva is not injected. Exudate present.  Pulmonary:     Effort: Pulmonary effort is normal.  Neurological:     Mental Status: She is alert.     UC Treatments / Results  Labs (all labs ordered are listed, but only abnormal results are displayed) Labs Reviewed - No data to display  EKG   Radiology No results found.  Procedures Procedures (including critical care time)  Medications Ordered in UC Medications - No data to display  Initial Impression / Assessment and Plan / UC Course  I have reviewed the triage vital signs and the nursing notes.  Pertinent labs & imaging results that were available during my care of the patient were reviewed by me and considered in my medical decision making (see chart for details).    Will treat with polytrim. Pt to continue warm compresses and use tear-free shampoo to wash eyelid margins  Final Clinical Impressions(s) / UC Diagnoses   Final diagnoses:  Hordeolum internum of right lower eyelid     Discharge Instructions      Use warm compress at least twice a day using a clean washcloth each time. Use  baby shampoo (tear-free or no more tears) to wash your eyelashes once a day.    ED Prescriptions     Medication Sig Dispense Auth. Provider   trimethoprim-polymyxin b (POLYTRIM) ophthalmic solution Place 1 drop into the right eye every 4 (four) hours. 10 mL Carvel Getting, NP      PDMP not reviewed this encounter.   Carvel Getting, NP 04/24/21 [phone]

## 2021-04-24 NOTE — ED Triage Notes (Signed)
Patient presents to Urgent Care with complaints of eye drainage, irritation, and burning to right eye lid. Pt states this has been an intermittent issue going on for 2 weeks.   Denies changes to vision.

## 2021-04-24 NOTE — Discharge Instructions (Signed)
Use warm compress at least twice a day using a clean washcloth each time. Use baby shampoo (tear-free or no more tears) to wash your eyelashes once a day.

## 2021-07-03 ENCOUNTER — Telehealth (HOSPITAL_COMMUNITY): Payer: Self-pay | Admitting: Clinical

## 2021-07-08 ENCOUNTER — Ambulatory Visit (HOSPITAL_COMMUNITY): Payer: Self-pay | Admitting: Clinical

## 2021-08-15 ENCOUNTER — Emergency Department (HOSPITAL_COMMUNITY)
Admission: EM | Admit: 2021-08-15 | Discharge: 2021-08-16 | Discharge status: Against Medical Advice | Payer: 59 | Attending: Emergency Medicine | Admitting: Emergency Medicine

## 2021-08-15 ENCOUNTER — Other Ambulatory Visit: Payer: Self-pay

## 2021-08-15 ENCOUNTER — Encounter (HOSPITAL_COMMUNITY): Payer: Self-pay | Admitting: Emergency Medicine

## 2021-08-15 DIAGNOSIS — M79644 Pain in right finger(s): Secondary | ICD-10-CM | POA: Diagnosis present

## 2021-08-15 DIAGNOSIS — Z5321 Procedure and treatment not carried out due to patient leaving prior to being seen by health care provider: Secondary | ICD-10-CM | POA: Insufficient documentation

## 2021-08-15 DIAGNOSIS — M7989 Other specified soft tissue disorders: Secondary | ICD-10-CM | POA: Diagnosis not present

## 2021-08-15 LAB — CBC WITH DIFFERENTIAL/PLATELET
Abs Immature Granulocytes: 0.03 10*3/uL (ref 0.00–0.07)
Basophils Absolute: 0 10*3/uL (ref 0.0–0.1)
Basophils Relative: 0 %
Eosinophils Absolute: 0.1 10*3/uL (ref 0.0–0.5)
Eosinophils Relative: 1 %
HCT: 34.4 % — ABNORMAL LOW (ref 36.0–46.0)
Hemoglobin: 10.8 g/dL — ABNORMAL LOW (ref 12.0–15.0)
Immature Granulocytes: 0 %
Lymphocytes Relative: 36 %
Lymphs Abs: 2.5 10*3/uL (ref 0.7–4.0)
MCH: 28.9 pg (ref 26.0–34.0)
MCHC: 31.4 g/dL (ref 30.0–36.0)
MCV: 92 fL (ref 80.0–100.0)
Monocytes Absolute: 0.6 10*3/uL (ref 0.1–1.0)
Monocytes Relative: 9 %
Neutro Abs: 3.7 10*3/uL (ref 1.7–7.7)
Neutrophils Relative %: 54 %
Platelets: 335 10*3/uL (ref 150–400)
RBC: 3.74 MIL/uL — ABNORMAL LOW (ref 3.87–5.11)
RDW: 16.8 % — ABNORMAL HIGH (ref 11.5–15.5)
WBC: 6.9 10*3/uL (ref 4.0–10.5)
nRBC: 0 % (ref 0.0–0.2)

## 2021-08-15 LAB — BASIC METABOLIC PANEL
Anion gap: 6 (ref 5–15)
BUN: 17 mg/dL (ref 6–20)
CO2: 22 mmol/L (ref 22–32)
Calcium: 8.8 mg/dL — ABNORMAL LOW (ref 8.9–10.3)
Chloride: 111 mmol/L (ref 98–111)
Creatinine, Ser: 0.87 mg/dL (ref 0.44–1.00)
GFR, Estimated: >60 mL/min (ref >60)
Glucose, Bld: 109 mg/dL — ABNORMAL HIGH (ref 70–99)
Potassium: 4 mmol/L (ref 3.5–5.1)
Sodium: 139 mmol/L (ref 135–145)

## 2021-08-15 NOTE — ED Provider Triage Note (Signed)
Emergency Medicine Provider Triage Evaluation Note ? ?Wendy Pitts , a 26 y.o. female  was evaluated in triage.  Pt complains of right middle finger pain and swelling of 3-day duration.  She states she was biting her nails 3 days ago.  She has tried ibuprofen, warm water soaks without improvement.  Denies fever. ? ?Review of Systems  ?Positive: As above ?Negative: As above ? ?Physical Exam  ?BP 118/74   Pulse (!) 109   Temp 98.8 ?F (37.1 ?C) (Oral)   Resp 16   SpO2 95%  ?Gen:   Awake, no distress   ?Resp:  Normal effort  ?MSK:   Moves extremities without difficulty.  Swelling noted to the distal end of the right middle finger.  Unable to fully extend the affected digit, or flex the affected digit.  Without tenderness to palpation over the flexor tendon sheath. ?Other:  Tachycardic ? ?Medical Decision Making  ?Medically screening exam initiated at 9:41 PM.  Appropriate orders placed.  Shonta Horst was informed that the remainder of the evaluation will be completed by another provider, this initial triage assessment does not replace that evaluation, and the importance of remaining in the ED until their evaluation is complete. ? ? ?  ?Marita Kansas, PA-C ?08/15/21 2143 ? ?

## 2021-08-15 NOTE — ED Triage Notes (Signed)
Presents for R middle finger swelling and wound that she thinks may be related to biting her nails. Notes yellow drainage, warmth, and swelling x 3 days.  ?

## 2021-08-16 NOTE — ED Notes (Signed)
Pt name called for updated vitals, no response. Pt not in bathroom or outside ?

## 2021-08-17 ENCOUNTER — Encounter (HOSPITAL_COMMUNITY): Payer: Self-pay | Admitting: Emergency Medicine

## 2021-08-17 ENCOUNTER — Ambulatory Visit (HOSPITAL_COMMUNITY)
Admission: EM | Admit: 2021-08-17 | Discharge: 2021-08-17 | Disposition: A | Discharge status: Home / Self Care | Payer: 59 | Attending: Emergency Medicine | Admitting: Emergency Medicine

## 2021-08-17 DIAGNOSIS — Z113 Encounter for screening for infections with a predominantly sexual mode of transmission: Secondary | ICD-10-CM | POA: Diagnosis present

## 2021-08-17 DIAGNOSIS — L03011 Cellulitis of right finger: Secondary | ICD-10-CM

## 2021-08-17 MED ORDER — FLUCONAZOLE 150 MG PO TABS: 150.0000 mg | ORAL_TABLET | Freq: Every day | ORAL | 0 refills | Status: AC

## 2021-08-17 MED ORDER — DOXYCYCLINE HYCLATE 100 MG PO CAPS: 100.0000 mg | ORAL_CAPSULE | Freq: Two times a day (BID) | ORAL | 0 refills | Status: DC

## 2021-08-17 NOTE — ED Provider Notes (Signed)
?[place] ? ? ? ?CSN: [id number] ?Arrival date & time: 08/17/21  1244 ? ? ?  ? ?History   ?Chief Complaint ?Chief Complaint  ?Patient presents with  ? Finger Swelling   ? SEXUALLY TRANSMITTED DISEASE  ? ? ?HPI ?Wendy Pitts is a 26 y.o. female.  ? ?Patient presents with tenderness, swelling and yellow puslike drainage to the right middle finger for 3 days.  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  Describes the area as pimple-like before drainage.Marland Kitchen  Has attempted use of warm compresses which has been helpful.  Range of motion is intact.  Denies fever or chills.  ? ?Requesting STD testing.  2 partners, female/female, sometimes partner use.  No known exposure. ? ? ? ?Past Medical History:  ?Diagnosis Date  ? Anxiety   ? ? ?There are no problems to display for this patient. ? ? ?History reviewed. No pertinent surgical history. ? ?OB History   ?No obstetric history on file. ?  ? ? ? ?Home Medications   ? ?Prior to Admission medications   ?Medication Sig Start Date End Date Taking? Authorizing Provider  ?metroNIDAZOLE (FLAGYL) 500 MG tablet Take 1 tablet (500 mg total) by mouth 2 (two) times daily. 01/28/21   Hans Eden, NP  ?trimethoprim-polymyxin b (POLYTRIM) ophthalmic solution Place 1 drop into the right eye every 4 (four) hours. 04/24/21   Carvel Getting, NP  ?UNABLE TO FIND Med Name: Wendie Simmer - for anxiety/depression    [provider]  ? ? ?Family History ?History reviewed. No pertinent family history. ? ?Social History ?Social History  ? ?Tobacco Use  ? Smoking status: Never  ? Smokeless tobacco: Never  ?Vaping Use  ? Vaping Use: Never used  ?Substance Use Topics  ? Alcohol use: No  ? Drug use: No  ? ? ? ?Allergies   ?Patient has no known allergies. ? ? ?Review of Systems ?Review of Systems  ?Constitutional: Negative.   ?Respiratory: Negative.    ?Cardiovascular: Negative.   ?Genitourinary: Negative.   ?Skin:  Positive for wound. Negative for color  change, pallor and rash.  ? ? ?Physical Exam ?Triage Vital Signs ?ED Triage Vitals  ?Enc Vitals Group  ?   BP 08/17/21 1356 109/73  ?   Pulse Rate 08/17/21 1356 77  ?   Resp 08/17/21 1356 17  ?   Temp 08/17/21 1356 99 ?F (37.2 ?C)  ?   Temp Source 08/17/21 1356 Oral  ?   SpO2 08/17/21 1356 96 %  ?   Weight 08/17/21 1355 250 lb (113.4 kg)  ?   Height 08/17/21 1355 5\' 4"  (1.626 m)  ?   Head Circumference --   ?   Peak Flow --   ?   Pain Score 08/17/21 1354 4  ?   Pain Loc --   ?   Pain Edu? --   ?   Excl. in [place]? --   ? ?No data found. ? ?Updated Vital Signs ?BP 109/73 (BP Location: Right Arm)   Pulse 77   Temp 99 ?F (37.2 ?C) (Oral)   Resp 17   Ht 5\' 4"  (1.626 m)   Wt 250 lb (113.4 kg)   SpO2 96%   BMI 42.91 kg/m?  ? ?Visual Acuity ?Right Eye Distance:   ?Left Eye Distance:   ?Bilateral Distance:   ? ?Right Eye Near:   ?Left Eye Near:    ?Bilateral Near:    ? ?  Physical Exam ?Constitutional:   ?   Appearance: Normal appearance.  ?HENT:  ?   Head: Normocephalic.  ?Eyes:  ?   Extraocular Movements: Extraocular movements intact.  ?Pulmonary:  ?   Effort: Pulmonary effort is normal.  ?Genitourinary: ?   Comments: Deferred, self collect vaginal swab ?Skin: ?   Comments: Tenderness, swelling and yellow drainage noted to the lateral nailbed of the right middle finger, no involvement of the DIP joint, capillary refill less than 3, sensation intact  ?Neurological:  ?   Mental Status: She is alert and oriented to person, place, and time. Mental status is at baseline.  ?Psychiatric:     ?   Mood and Affect: Mood normal.     ?   Behavior: Behavior normal.  ? ? ? ?UC Treatments / Results  ?Labs ?(all labs ordered are listed, but only abnormal results are displayed) ?Labs Reviewed  ?CERVICOVAGINAL ANCILLARY ONLY  ? ? ?EKG ? ? ?Radiology ?No results found. ? ?Procedures ?Procedures (including critical care time) ? ?Medications Ordered in UC ?Medications - No data to display ? ?Initial Impression / Assessment and Plan / UC  Course  ?I have reviewed the triage vital signs and the nursing notes. ? ?Pertinent labs & imaging results that were available during my care of the patient were reviewed by me and considered in my medical decision making (see chart for details). ? ?Paronychia of right finger ?Routine screening for STI in the setting ? ?As area has already begun to drain, will defer puncture and aspirate, discussed with patient, doxycycline 7-day course prescribed, recommended continued warm compresses or soaks to the affected area, advised against nail grooming or biting to prevent further infection or reoccurrence given precautions to follow-up with the urgent care for nonhealing nondraining site ? ?STI labs are pending, will treat per protocol, advised abstinence until lab results and/or treatment is complete, advised condom use during all sexual encounters moving forward, may follow-up with urgent care as needed ?Final Clinical Impressions(s) / UC Diagnoses  ? ?Final diagnoses:  ?Routine screening for STI (sexually transmitted infection)  ? ?Discharge Instructions   ?None ?  ? ?ED Prescriptions   ?None ?  ? ?PDMP not reviewed this encounter. ?  ?Hans Eden, NP ?08/17/21 1450 ? ?

## 2021-08-17 NOTE — ED Triage Notes (Signed)
Pt reports right middle finger swelling. States she pulled out an ingrown nail and since then noticed yellow drainage.  ?Also requesting to be tested for STDs. Denies any symptoms.  ?

## 2021-08-17 NOTE — Discharge Instructions (Addendum)
Take doxycycline twice daily for 7 days, may use Diflucan if yeast symptoms occur, take 1 tablet and then in 3 days if symptoms are still present may take second dose ? ?Avoid any grooming to the nail or nail biting to prevent reoccurrence of infection or worsening symptoms ? ?Hold warm-hot compresses to affected area at least 4 times a day, this helps to facilitate draining, the more the better ? ?Please return for evaluation for increased swelling, increased tenderness or pain, non healing site, non draining site, you begin to have fever or chill ? ?Labs pending 2-3 days, you will be contacted if positive for any sti and treatment will be sent to the pharmacy, you will have to return to the clinic if positive for gonorrhea to receive treatment  ? ?Please refrain from having sex until labs results, if positive please refrain from having sex until treatment complete and symptoms resolve  ? ?If positive for , Chlamydia  gonorrhea or trichomoniasis please notify partner or partners so they may tested as well ? ?Moving forward, it is recommended you use some form of protection against the transmission of sti infections  such as condoms or dental dams with each sexual encounter  ? ?

## 2021-08-19 LAB — CERVICOVAGINAL ANCILLARY ONLY
Bacterial Vaginitis (gardnerella): POSITIVE — AB
Candida Glabrata: NEGATIVE
Candida Vaginitis: NEGATIVE
Chlamydia: NEGATIVE
Comment: NEGATIVE
Comment: NEGATIVE
Comment: NEGATIVE
Comment: NEGATIVE
Comment: NEGATIVE
Comment: NORMAL
Neisseria Gonorrhea: NEGATIVE
Trichomonas: POSITIVE — AB

## 2021-08-20 ENCOUNTER — Telehealth (HOSPITAL_COMMUNITY): Payer: Self-pay | Admitting: Emergency Medicine

## 2021-08-20 MED ORDER — METRONIDAZOLE 500 MG PO TABS: 500.0000 mg | ORAL_TABLET | Freq: Two times a day (BID) | ORAL | 0 refills | Status: DC

## 2021-09-04 ENCOUNTER — Telehealth (HOSPITAL_BASED_OUTPATIENT_CLINIC_OR_DEPARTMENT_OTHER): Payer: 59 | Admitting: Psychiatry

## 2021-09-04 ENCOUNTER — Encounter (HOSPITAL_COMMUNITY): Payer: Self-pay | Admitting: Psychiatry

## 2021-09-04 VITALS — Wt 250.0 lb

## 2021-09-04 DIAGNOSIS — F4312 Post-traumatic stress disorder, chronic: Secondary | ICD-10-CM | POA: Diagnosis not present

## 2021-09-04 DIAGNOSIS — F902 Attention-deficit hyperactivity disorder, combined type: Secondary | ICD-10-CM | POA: Diagnosis not present

## 2021-09-04 DIAGNOSIS — F3160 Bipolar disorder, current episode mixed, unspecified: Secondary | ICD-10-CM

## 2021-09-04 DIAGNOSIS — F1210 Cannabis abuse, uncomplicated: Secondary | ICD-10-CM | POA: Diagnosis not present

## 2021-09-04 MED ORDER — ARIPIPRAZOLE 2 MG PO TABS: 2.0000 mg | ORAL_TABLET | Freq: Every day | ORAL | 0 refills | Status: DC

## 2021-09-04 MED ORDER — BUPROPION HCL ER (XL) 300 MG PO TB24: 300.0000 mg | ORAL_TABLET | Freq: Every day | ORAL | 0 refills | Status: DC
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
## 2021-10-02 ENCOUNTER — Telehealth (HOSPITAL_COMMUNITY): Payer: Self-pay | Admitting: Psychiatry

## 2021-10-02 ENCOUNTER — Telehealth (HOSPITAL_BASED_OUTPATIENT_CLINIC_OR_DEPARTMENT_OTHER): Payer: 59 | Admitting: Psychiatry

## 2021-10-02 ENCOUNTER — Encounter (HOSPITAL_COMMUNITY): Payer: Self-pay | Admitting: Psychiatry

## 2021-10-02 VITALS — Wt 250.0 lb

## 2021-10-02 DIAGNOSIS — F3160 Bipolar disorder, current episode mixed, unspecified: Secondary | ICD-10-CM

## 2021-10-02 DIAGNOSIS — F902 Attention-deficit hyperactivity disorder, combined type: Secondary | ICD-10-CM | POA: Diagnosis not present

## 2021-10-02 DIAGNOSIS — F1210 Cannabis abuse, uncomplicated: Secondary | ICD-10-CM

## 2021-10-02 DIAGNOSIS — F4312 Post-traumatic stress disorder, chronic: Secondary | ICD-10-CM

## 2021-10-02 MED ORDER — HYDROXYZINE PAMOATE 25 MG PO CAPS: 25.0000 mg | ORAL_CAPSULE | Freq: Every evening | ORAL | 0 refills | Status: DC | PRN

## 2021-10-02 MED ORDER — FLUOXETINE HCL 10 MG PO CAPS: 10.0000 mg | ORAL_CAPSULE | Freq: Every day | ORAL | 0 refills | Status: DC
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
## 2021-10-02 NOTE — Telephone Encounter (Signed)
D:  Dr. Lolly Mustache referred pt to MH-IOP.  A:  Placed call to orient, but there was no answer.  Will attempt again after lunch.  Inform Dr. Lolly Mustache.

## 2021-10-17 ENCOUNTER — Telehealth (HOSPITAL_COMMUNITY): Payer: 59 | Admitting: Psychiatry

## 2021-10-25 ENCOUNTER — Telehealth (HOSPITAL_COMMUNITY): Payer: 59 | Admitting: Psychiatry

## 2021-10-25 ENCOUNTER — Encounter (HOSPITAL_COMMUNITY): Payer: Self-pay

## 2021-11-18 ENCOUNTER — Telehealth (HOSPITAL_COMMUNITY): Payer: Self-pay | Admitting: Psychiatry

## 2021-12-04 ENCOUNTER — Ambulatory Visit (INDEPENDENT_AMBULATORY_CARE_PROVIDER_SITE_OTHER): Payer: 59 | Admitting: Licensed Clinical Social Worker

## 2021-12-04 ENCOUNTER — Telehealth (HOSPITAL_COMMUNITY): Payer: Self-pay | Admitting: Licensed Clinical Social Worker

## 2021-12-04 DIAGNOSIS — Z91199 Patient's noncompliance with other medical treatment and regimen due to unspecified reason: Secondary | ICD-10-CM

## 2021-12-04 NOTE — Telephone Encounter (Signed)
LCSW counselor attempted to connect with patient for scheduled in person appointment at 9am. Pt did not show for appointment. Attempted to reach patient by phone unsuccessfully (phone message was "call cannot be completed as dialed").  Visit will be coded as no show.  

## 2021-12-04 NOTE — Progress Notes (Signed)
LCSW counselor attempted to connect with patient for scheduled in person appointment at 9am. Pt did not show for appointment. Attempted to reach patient by phone unsuccessfully (phone message was "call cannot be completed as dialed").  Visit will be coded as no show.

## 2022-01-11 ENCOUNTER — Encounter (HOSPITAL_COMMUNITY): Payer: Self-pay | Admitting: Emergency Medicine

## 2022-01-11 ENCOUNTER — Ambulatory Visit (HOSPITAL_COMMUNITY)
Admission: EM | Admit: 2022-01-11 | Discharge: 2022-01-11 | Disposition: A | Discharge status: Home / Self Care | Payer: 59 | Attending: Nurse Practitioner | Admitting: Nurse Practitioner

## 2022-01-11 DIAGNOSIS — Z3202 Encounter for pregnancy test, result negative: Secondary | ICD-10-CM | POA: Diagnosis present

## 2022-01-11 DIAGNOSIS — N939 Abnormal uterine and vaginal bleeding, unspecified: Secondary | ICD-10-CM | POA: Insufficient documentation

## 2022-01-11 LAB — HCG, QUANTITATIVE, PREGNANCY: hCG, Beta Chain, Quant, S: 1 m[IU]/mL (ref <5)

## 2022-01-11 LAB — POC URINE PREG, ED: Preg Test, Ur: NEGATIVE

## 2022-01-11 MED ORDER — MEGESTROL ACETATE 40 MG PO TABS: 40.0000 mg | ORAL_TABLET | Freq: Two times a day (BID) | ORAL | 0 refills | Status: AC

## 2022-01-11 NOTE — ED Provider Notes (Signed)
[facility]    CSN: [id number] Arrival date & time: 01/11/22  1230      History   Chief Complaint Chief Complaint  Patient presents with   Vaginal Bleeding    HPI Wendy Pitts is a 26 y.o. female.   Patient presents for vaginal bleeding, lower abdominal cramping, and mid low back pain that has been present and worsening.  Reports the bleeding started approximately 12/21/2021.  She started birth control on that day.  Last normal menstrual period was 11/23/2021.  Reports she has been sexually active since her last menstrual period.  She took 2 pregnancy tests at home last night that were both positive-states they were the early detection tests.    She reports the vaginal bleeding has been worse since 01/06/2022 after sexual activity.  Reports this morning, there were clots in the vaginal bleeding.  She is currently sexually active, uses condoms some of the time.    Past Medical History:  Diagnosis Date   Anxiety     There are no problems to display for this patient.   History reviewed. No pertinent surgical history.  OB History   No obstetric history on file.      Home Medications    Prior to Admission medications   Medication Sig Start Date End Date Taking? Authorizing Provider  FLUoxetine (PROZAC) 10 MG capsule Take 1 capsule (10 mg total) by mouth daily. 10/02/21 10/02/22 Yes Arfeen, Phillips Grout, MD  hydrOXYzine (VISTARIL) 25 MG capsule Take 1 capsule (25 mg total) by mouth at bedtime as needed for anxiety. 10/02/21  Yes Arfeen, Phillips Grout, MD  megestrol (MEGACE) 40 MG tablet Take 1 tablet (40 mg total) by mouth 2 (two) times daily for 7 days. 01/11/22 01/18/22 Yes Valentino Nose, NP  norethindrone (MICRONOR) 0.35 MG tablet Take 1 tablet by mouth daily. 12/21/21  Yes [provider]  ARIPiprazole (ABILIFY) 2 MG tablet Take 1 tablet (2 mg total) by mouth daily. Patient not taking: Reported on 10/02/2021 09/04/21   Arfeen, Phillips Grout, MD  buPROPion (WELLBUTRIN  XL) 300 MG 24 hr tablet Take 1 tablet (300 mg total) by mouth daily. Patient not taking: Reported on 10/02/2021 09/04/21   Arfeen, Phillips Grout, MD  doxycycline (VIBRAMYCIN) 100 MG capsule Take 1 capsule (100 mg total) by mouth 2 (two) times daily. Patient not taking: Reported on 09/04/2021 08/17/21   Valinda Hoar, NP  metroNIDAZOLE (FLAGYL) 500 MG tablet Take 1 tablet (500 mg total) by mouth 2 (two) times daily. Patient not taking: Reported on 09/04/2021 08/20/21   Merrilee Jansky, MD  trimethoprim-polymyxin b (POLYTRIM) ophthalmic solution Place 1 drop into the right eye every 4 (four) hours. Patient not taking: Reported on 09/04/2021 04/24/21   Cathlyn Parsons, NP    Family History Family History  Problem Relation Age of Onset   Bipolar disorder Mother     Social History Social History   Tobacco Use   Smoking status: Never   Smokeless tobacco: Never  Vaping Use   Vaping Use: Never used  Substance Use Topics   Alcohol use: No   Drug use: No     Allergies   Patient has no known allergies.   Review of Systems Review of Systems Per HPI  Physical Exam Triage Vital Signs ED Triage Vitals  Enc Vitals Group     BP 01/11/22 1237 109/75     Pulse Rate 01/11/22 1237 98     Resp 01/11/22 1237 16  Temp 01/11/22 1237 97.9 F (36.6 C)     Temp Source 01/11/22 1237 Oral     SpO2 01/11/22 1237 95 %     Weight --      Height --      Head Circumference --      Peak Flow --      Pain Score 01/11/22 1252 8     Pain Loc --      Pain Edu? --      Excl. in GC? --    No data found.  Updated Vital Signs BP 109/75 (BP Location: Left Arm)   Pulse 98   Temp 97.9 F (36.6 C) (Oral)   Resp 16   SpO2 95%   Visual Acuity Right Eye Distance:   Left Eye Distance:   Bilateral Distance:    Right Eye Near:   Left Eye Near:    Bilateral Near:     Physical Exam Vitals and nursing note reviewed.  Constitutional:      General: She is not in acute distress.    Appearance: Normal  appearance. She is obese. She is not toxic-appearing.  HENT:     Head: Normocephalic and atraumatic.     Mouth/Throat:     Mouth: Mucous membranes are moist.     Pharynx: Oropharynx is clear.  Pulmonary:     Effort: Pulmonary effort is normal. No respiratory distress.  Abdominal:     General: Abdomen is flat.     Palpations: Abdomen is soft.     Tenderness: There is no right CVA tenderness, left CVA tenderness or guarding.  Musculoskeletal:     Cervical back: Normal range of motion.       Back:     Comments: Inspection: No swelling, obvious deformity, or redness to the mid low back  Palpation: Mid low back tender to palpation approximately area marked; no obvious deformities palpated  Full movement without weakness of lower extremities  Skin:    General: Skin is warm and dry.     Coloration: Skin is not jaundiced or pale.     Findings: No erythema.  Neurological:     Mental Status: She is alert and oriented to person, place, and time.  Psychiatric:        Behavior: Behavior is cooperative.      UC Treatments / Results  Labs (all labs ordered are listed, but only abnormal results are displayed) Labs Reviewed  HCG, QUANTITATIVE, PREGNANCY  POC URINE PREG, ED    EKG   Radiology No results found.  Procedures Procedures (including critical care time)  Medications Ordered in UC Medications - No data to display  Initial Impression / Assessment and Plan / UC Course  I have reviewed the triage vital signs and the nursing notes.  Pertinent labs & imaging results that were available during my care of the patient were reviewed by me and considered in my medical decision making (see chart for details).    Patient is well-appearing, normotensive, afebrile, not tachycardic, not tachypneic, oxygenating well on room air.  Urine pregnancy test negative, stat hCG drawn which was 1.  Discussed with patient that she has tested negative for pregnancy today.  Start Megace to help  with vaginal bleeding.  We discussed that vaginal bleeding is common side effect of oral progesterone only contraceptives.  Recommended close follow-up with prescriber of oral contraceptive for further evaluation if Megace does not help with bleeding.  If symptoms worsen, go to ER.  The patient  was given the opportunity to ask questions.  All questions answered to their satisfaction.  The patient is in agreement to this plan.    Final Clinical Impressions(s) / UC Diagnoses   Final diagnoses:  Vaginal bleeding  Negative pregnancy test     Discharge Instructions      Blood and urine pregnancy testing were negative today.  We have sent Megace to the pharmacy for you to start on.  Please take this as prescribed to help with the bleeding.    Please make an appointment for follow up with the person who prescribes your contraceptive in the next week for re-evaluation of symptoms and for further work up if needed.     ED Prescriptions     Medication Sig Dispense Auth. Provider   megestrol (MEGACE) 40 MG tablet Take 1 tablet (40 mg total) by mouth 2 (two) times daily for 7 days. 14 tablet Eulogio Bear, NP      PDMP not reviewed this encounter.   Eulogio Bear, NP 01/11/22 1534

## 2022-01-11 NOTE — ED Triage Notes (Signed)
Patient was started on a birth control pill, norethindrone, on 12/21/2021. States her last normal menstrual cycle prior to that was 8/19. She reports that prior to starting her on the new birth control, that the provider did not test her for pregnancy. After starting the new birth control, she began spotting until 10/2. During intercourse on 10/2, she began to have a large amount of vaginal bleeding. The bleeding stopped that night. Last night, she tested positive, twice, for pregnancy. She then began to notice some abdominal cramping, and began having vaginal bleeding again, this time with clotting, and continues to have vaginal bleeding into today. Reports current lower abdominal and back pain/cramping at this time, vomited several times yesterday. Denies fever, change in urinary/bowel habits.

## 2022-01-11 NOTE — Discharge Instructions (Addendum)
Blood and urine pregnancy testing were negative today.  We have sent Megace to the pharmacy for you to start on.  Please take this as prescribed to help with the bleeding.    Please make an appointment for follow up with the person who prescribes your contraceptive in the next week for re-evaluation of symptoms and for further work up if needed.

## 2022-03-24 ENCOUNTER — Telehealth (HOSPITAL_COMMUNITY): Payer: 59 | Admitting: Psychiatry

## 2022-12-12 ENCOUNTER — Emergency Department (HOSPITAL_COMMUNITY)
Admission: EM | Admit: 2022-12-12 | Discharge: 2022-12-13 | Disposition: A | Discharge status: Home / Self Care | Payer: 59 | Attending: Emergency Medicine | Admitting: Emergency Medicine

## 2022-12-12 ENCOUNTER — Other Ambulatory Visit: Payer: Self-pay

## 2022-12-12 DIAGNOSIS — R102 Pelvic and perineal pain: Secondary | ICD-10-CM

## 2022-12-12 DIAGNOSIS — N939 Abnormal uterine and vaginal bleeding, unspecified: Secondary | ICD-10-CM

## 2022-12-12 DIAGNOSIS — Z711 Person with feared health complaint in whom no diagnosis is made: Secondary | ICD-10-CM | POA: Diagnosis not present

## 2022-12-12 LAB — CBC
HCT: 35.6 % — ABNORMAL LOW (ref 36.0–46.0)
Hemoglobin: 11.4 g/dL — ABNORMAL LOW (ref 12.0–15.0)
MCH: 29.5 pg (ref 26.0–34.0)
MCHC: 32 g/dL (ref 30.0–36.0)
MCV: 92 fL (ref 80.0–100.0)
Platelets: 310 10*3/uL (ref 150–400)
RBC: 3.87 MIL/uL (ref 3.87–5.11)
RDW: 15.3 % (ref 11.5–15.5)
WBC: 7.6 10*3/uL (ref 4.0–10.5)
nRBC: 0 % (ref 0.0–0.2)

## 2022-12-12 LAB — URINALYSIS, ROUTINE W REFLEX MICROSCOPIC
Bilirubin Urine: NEGATIVE
Glucose, UA: NEGATIVE mg/dL
Ketones, ur: 5 mg/dL — AB
Nitrite: NEGATIVE
Protein, ur: 100 mg/dL — AB
RBC / HPF: >50 RBC/hpf (ref 0–5)
Specific Gravity, Urine: 1.028 (ref 1.005–1.030)
WBC, UA: >50 WBC/hpf (ref 0–5)
pH: 5 (ref 5.0–8.0)

## 2022-12-12 LAB — COMPREHENSIVE METABOLIC PANEL
ALT: 18 U/L (ref 0–44)
AST: 19 U/L (ref 15–41)
Albumin: 3.5 g/dL (ref 3.5–5.0)
Alkaline Phosphatase: 72 U/L (ref 38–126)
Anion gap: 7 (ref 5–15)
BUN: 8 mg/dL (ref 6–20)
CO2: 22 mmol/L (ref 22–32)
Calcium: 8.5 mg/dL — ABNORMAL LOW (ref 8.9–10.3)
Chloride: 108 mmol/L (ref 98–111)
Creatinine, Ser: 0.8 mg/dL (ref 0.44–1.00)
GFR, Estimated: >60 mL/min (ref >60)
Glucose, Bld: 89 mg/dL (ref 70–99)
Potassium: 3.8 mmol/L (ref 3.5–5.1)
Sodium: 137 mmol/L (ref 135–145)
Total Bilirubin: 0.3 mg/dL (ref 0.3–1.2)
Total Protein: 7.1 g/dL (ref 6.5–8.1)

## 2022-12-12 LAB — HCG, SERUM, QUALITATIVE: Preg, Serum: NEGATIVE

## 2022-12-12 LAB — LIPASE, BLOOD: Lipase: 35 U/L (ref 11–51)

## 2022-12-12 NOTE — ED Triage Notes (Signed)
Pt arrives to ED c/o abdominal pain accompanied with vaginal discharge/spotting x 3 weeks. Pt denies nausea/vomiting or pregnancy. Pt assumes that spotting is from menstrual cycle

## 2022-12-13 ENCOUNTER — Encounter (HOSPITAL_COMMUNITY): Payer: Self-pay

## 2022-12-13 ENCOUNTER — Emergency Department (HOSPITAL_COMMUNITY): Payer: 59

## 2022-12-13 LAB — WET PREP, GENITAL
Clue Cells Wet Prep HPF POC: NONE SEEN
Sperm: NONE SEEN
Trich, Wet Prep: NONE SEEN
WBC, Wet Prep HPF POC: >=10 — AB (ref <10)
Yeast Wet Prep HPF POC: NONE SEEN

## 2022-12-13 LAB — HIV ANTIBODY (ROUTINE TESTING W REFLEX): HIV Screen 4th Generation wRfx: NONREACTIVE

## 2022-12-13 MED ORDER — CEFTRIAXONE SODIUM 500 MG IJ SOLR
500.0000 mg | Freq: Once | INTRAMUSCULAR | Status: AC
  Administered 2022-12-13: 500 mg via INTRAMUSCULAR
  Filled 2022-12-13: qty 500

## 2022-12-13 MED ORDER — DOXYCYCLINE HYCLATE 100 MG PO CAPS: 100.0000 mg | ORAL_CAPSULE | Freq: Two times a day (BID) | ORAL | 0 refills | Status: DC

## 2022-12-13 MED ORDER — LIDOCAINE HCL (PF) 1 % IJ SOLN
INTRAMUSCULAR | Status: AC
  Administered 2022-12-13: 5 mL
  Filled 2022-12-13: qty 5

## 2022-12-13 MED ORDER — IBUPROFEN 400 MG PO TABS
600.0000 mg | ORAL_TABLET | Freq: Once | ORAL | Status: AC
  Administered 2022-12-13: 600 mg via ORAL
  Filled 2022-12-13: qty 1

## 2022-12-13 MED ORDER — IBUPROFEN 600 MG PO TABS: 600.0000 mg | ORAL_TABLET | Freq: Four times a day (QID) | ORAL | 0 refills | Status: DC | PRN

## 2022-12-13 MED ORDER — DOXYCYCLINE HYCLATE 100 MG PO TABS
100.0000 mg | ORAL_TABLET | Freq: Once | ORAL | Status: AC
  Administered 2022-12-13: 100 mg via ORAL
  Filled 2022-12-13: qty 1

## 2022-12-13 NOTE — ED Notes (Signed)
Patient transported to Ultrasound 

## 2022-12-13 NOTE — ED Provider Notes (Signed)
[facility] AT [facility] Provider Note   CSN: [id number] Arrival date & time: 12/12/22  2046     History  Chief Complaint  Patient presents with   Abdominal Pain   Vaginal Bleeding    Wendy Pitts is a 27 y.o. female.  HPI     This is a 27 year old female who presents with multiple complaints.  Patient reports lower suprapubic and pelvic discomfort.  She states that she has significant pain when she has a bowel movement.  She states "I feel like I am pooping glass."  Has not noted any hemorrhoids but has noted rectal bleeding.  Additionally she states that her menstrual period just finished but she has had recurrent vaginal spotting.  Last normal menstrual period Aug 19-23.  She is also noted some clear discharge.  She does have a new sexual partner and would like to be tested for STDs.  No dysuria.  No fevers.  Home Medications Prior to Admission medications   Medication Sig Start Date End Date Taking? Authorizing Provider  doxycycline (VIBRAMYCIN) 100 MG capsule Take 1 capsule (100 mg total) by mouth 2 (two) times daily. 12/13/22  Yes Mickie Kozikowski, Mayer Masker, MD  ibuprofen (ADVIL) 600 MG tablet Take 1 tablet (600 mg total) by mouth every 6 (six) hours as needed. 12/13/22  Yes Naylah Cork, Mayer Masker, MD  ARIPiprazole (ABILIFY) 2 MG tablet Take 1 tablet (2 mg total) by mouth daily. Patient not taking: Reported on 10/02/2021 09/04/21   Arfeen, Phillips Grout, MD  buPROPion (WELLBUTRIN XL) 300 MG 24 hr tablet Take 1 tablet (300 mg total) by mouth daily. Patient not taking: Reported on 10/02/2021 09/04/21   Cleotis Nipper, MD  FLUoxetine (PROZAC) 10 MG capsule Take 1 capsule (10 mg total) by mouth daily. 10/02/21 10/02/22  Arfeen, Phillips Grout, MD  hydrOXYzine (VISTARIL) 25 MG capsule Take 1 capsule (25 mg total) by mouth at bedtime as needed for anxiety. 10/02/21   Arfeen, Phillips Grout, MD  metroNIDAZOLE (FLAGYL) 500 MG tablet Take 1 tablet (500 mg total) by mouth 2 (two) times  daily. Patient not taking: Reported on 09/04/2021 08/20/21   Merrilee Jansky, MD  norethindrone (MICRONOR) 0.35 MG tablet Take 1 tablet by mouth daily. 12/21/21   [provider]  trimethoprim-polymyxin b (POLYTRIM) ophthalmic solution Place 1 drop into the right eye every 4 (four) hours. Patient not taking: Reported on 09/04/2021 04/24/21   Cathlyn Parsons, NP      Allergies    Patient has no known allergies.    Review of Systems   Review of Systems  Constitutional:  Negative for fever.  Respiratory:  Negative for shortness of breath.   Cardiovascular:  Negative for chest pain.  Gastrointestinal:  Positive for abdominal pain, blood in stool and rectal pain.  Genitourinary:  Positive for pelvic pain and vaginal discharge. Negative for dysuria.  All other systems reviewed and are negative.   Physical Exam Updated Vital Signs BP 106/63   Pulse 82   Temp 99.6 F (37.6 C) (Oral)   Resp 20   Ht 1.626 m (5\' 4" )   Wt (!) 145.2 kg   SpO2 100%   BMI 54.93 kg/m  Physical Exam Vitals and nursing note reviewed.  Constitutional:      Appearance: She is well-developed. She is obese. She is not ill-appearing.  HENT:     Head: Normocephalic and atraumatic.  Eyes:     Pupils: Pupils are equal, round, and reactive to  light.  Cardiovascular:     Rate and Rhythm: Normal rate and regular rhythm.     Heart sounds: Normal heart sounds.  Pulmonary:     Effort: Pulmonary effort is normal. No respiratory distress.     Breath sounds: No wheezing.  Abdominal:     General: Bowel sounds are normal.     Palpations: Abdomen is soft.     Tenderness: There is no abdominal tenderness.  Genitourinary:    Comments: Normal external vaginal exam, no obvious bleeding, cervix is normal without friability, no significant vaginal discharge Rectal exam without obvious external hemorrhoids, grossly negative for blood, no fissures noted Musculoskeletal:     Cervical back: Neck supple.  Skin:     General: Skin is warm and dry.  Neurological:     Mental Status: She is alert and oriented to person, place, and time.  Psychiatric:        Mood and Affect: Mood normal.     ED Results / Procedures / Treatments   Labs (all labs ordered are listed, but only abnormal results are displayed) Labs Reviewed  WET PREP, GENITAL - Abnormal; Notable for the following components:      Result Value   WBC, Wet Prep HPF POC >=10 (*)    All other components within normal limits  COMPREHENSIVE METABOLIC PANEL - Abnormal; Notable for the following components:   Calcium 8.5 (*)    All other components within normal limits  CBC - Abnormal; Notable for the following components:   Hemoglobin 11.4 (*)    HCT 35.6 (*)    All other components within normal limits  URINALYSIS, ROUTINE W REFLEX MICROSCOPIC - Abnormal; Notable for the following components:   APPearance CLOUDY (*)    Hgb urine dipstick LARGE (*)    Ketones, ur 5 (*)    Protein, ur 100 (*)    Leukocytes,Ua LARGE (*)    Bacteria, UA FEW (*)    All other components within normal limits  LIPASE, BLOOD  HCG, SERUM, QUALITATIVE  HIV ANTIBODY (ROUTINE TESTING W REFLEX)  GC/CHLAMYDIA PROBE AMP ([place]) NOT AT [facility]    EKG None  Radiology US PELVIC COMPLETE WITH TRANSVAGINAL  Result Date: 12/13/2022 CLINICAL DATA:  [id number] with pelvic pain for 3 weeks and vaginal spotting for 1 day. EXAM: TRANSABDOMINAL AND TRANSVAGINAL ULTRASOUND OF PELVIS TECHNIQUE: Both transabdominal and transvaginal ultrasound examinations of the pelvis were performed. Transabdominal technique was performed for global imaging of the pelvis including uterus, ovaries, adnexal regions, and pelvic cul-de-sac. It was necessary to proceed with endovaginal exam following the transabdominal exam to visualize the adnexal spaces and endometrium better. COMPARISON:  Ultrasound study of 07/11/2016. FINDINGS:.: FINDINGS:. Uterus Measurements: Anteverted measuring 8.9 x 4.7 x 5.7 cm  = volume: 122.6 mL. No fibroids or other mass visualized. The cervix is closed, with no visible focal abnormality. Endometrium Thickness: Prominent but normally echogenic and circumscribed, measuring 18.2 mm. No focal abnormality visualized. No abnormal color flow within the complex. There is trace hypodense fluid or blood in the proximal uterine cavity. Right ovary Not visualized. Left ovary Not visualized. Other findings No abnormal free fluid. IMPRESSION: Prominent endometrium measuring up to 18.2 mm with trace fluid or blood in the proximal uterine cavity. No focal abnormality or abnormal color flow. Findings are nonspecific and may be related to secretory phase of the menstrual cycle. If bleeding remains unresponsive to hormonal or medical therapy, focal lesion work-up with sonohysterogram should be considered. Endometrial biopsy should also be  considered in pre-menopausal patients at high risk for endometrial carcinoma. (Ref: Radiological Reasoning: Algorithmic Workup of Abnormal Vaginal Bleeding with Endovaginal Sonography and Sonohysterography. AJR 2008; 284:X32-44). Nonvisualization of the ovaries. Electronically Signed   By: Almira Bar M.D.   On: 12/13/2022 03:51    Procedures Procedures    Medications Ordered in ED Medications  ibuprofen (ADVIL) tablet 600 mg (has no administration in time range)  cefTRIAXone (ROCEPHIN) injection 500 mg (500 mg Intramuscular Given 12/13/22 0402)  doxycycline (VIBRA-TABS) tablet 100 mg (100 mg Oral Given 12/13/22 0402)  lidocaine (PF) (XYLOCAINE) 1 % injection (5 mLs  Given 12/13/22 0402)    ED Course/ Medical Decision Making/ A&P                                 Medical Decision Making Amount and/or Complexity of Data Reviewed Labs: ordered. Radiology: ordered.  Risk Prescription drug management.   This patient presents to the ED for concern of pelvic pain, vaginal discharge, this involves an extensive number of treatment options, and is a  complaint that carries with it a high risk of complications and morbidity.  I considered the following differential and admission for this acute, potentially life threatening condition.  The differential diagnosis includes abnormal uterine bleeding, pregnancy, rectal bleeding, ovarian cyst, ovarian torsion, UTI  MDM:    This is a 27 year old female who presents with pelvic discomfort, vaginal spotting, questionable rectal bleeding.  She is overall nontoxic and vital signs are reassuring.  No obvious vaginal or rectal bleeding on exam.  No gross hemorrhoids or fissures noted.  Hemoglobin is stable.  She has no lateralizing tenderness on exam.  Labs obtained and reviewed and are largely reassuring including urinalysis.  Patient was tested and treated for STDs.  Pelvic ultrasound obtained given discomfort and shows a thickened endometrial stripe.  Patient reports some abnormal uterine bleeding and ongoing spotting after her normal menstrual period.  She is not on any hormonal therapies.  Recommend close follow-up with her OB/GYN.  She states she has not established OB/GYN.  In the meantime we will treat her cramping with ibuprofen.  She will also be discharged with doxycycline to cover for STD.  (Labs, imaging, consults)  Labs: I Ordered, and personally interpreted labs.  The pertinent results include: CBC, CMP, lipase, wet prep, GC, urinalysis  Imaging Studies ordered: I ordered imaging studies including pelvic ultrasound I independently visualized and interpreted imaging. I agree with the radiologist interpretation  Additional history obtained from chart review.  External records from outside source obtained and reviewed including prior evaluations  Cardiac Monitoring: The patient was not maintained on a cardiac monitor.  If on the cardiac monitor, I personally viewed and interpreted the cardiac monitored which showed an underlying rhythm of: N/A  Reevaluation: After the interventions noted  above, I reevaluated the patient and found that they have :improved  Social Determinants of Health:  lives independently  Disposition: Discharge  Co morbidities that complicate the patient evaluation  Past Medical History:  Diagnosis Date   Anxiety      Medicines Meds ordered this encounter  Medications   cefTRIAXone (ROCEPHIN) injection 500 mg    Order Specific Question:   Antibiotic Indication:    Answer:   STD   doxycycline (VIBRA-TABS) tablet 100 mg   lidocaine (PF) (XYLOCAINE) 1 % injection    Amada Kingfisher K: cabinet override   ibuprofen (ADVIL) tablet 600 mg  ibuprofen (ADVIL) 600 MG tablet    Sig: Take 1 tablet (600 mg total) by mouth every 6 (six) hours as needed.    Dispense:  30 tablet    Refill:  0   doxycycline (VIBRAMYCIN) 100 MG capsule    Sig: Take 1 capsule (100 mg total) by mouth 2 (two) times daily.    Dispense:  20 capsule    Refill:  0    I have reviewed the patients home medicines and have made adjustments as needed  Problem List / ED Course: Problem List Items Addressed This Visit   None Visit Diagnoses     Pelvic pain    -  Primary   Abnormal vaginal bleeding       Concern about STD in female without diagnosis                       Final Clinical Impression(s) / ED Diagnoses Final diagnoses:  Pelvic pain  Abnormal vaginal bleeding  Concern about STD in female without diagnosis    Rx / DC Orders ED Discharge Orders          Ordered    ibuprofen (ADVIL) 600 MG tablet  Every 6 hours PRN        12/13/22 0419    doxycycline (VIBRAMYCIN) 100 MG capsule  2 times daily        12/13/22 0419              Bronsen Serano, Mayer Masker, MD 12/13/22 0422

## 2022-12-13 NOTE — ED Notes (Signed)
Pt stated "there is bleeding when taking a number 2, literally feels like shards of glass".

## 2022-12-13 NOTE — Discharge Instructions (Signed)
You were seen today for pelvic discomfort.  Your workup is notable for an ultrasound that shows a thickened endometrial stripe.  This can sometimes be normal if in the right phase of your menstrual cycle.  However if you have ongoing abnormal bleeding, you should follow-up with OB/GYN.  Take ibuprofen as needed for pelvic discomfort.  Regarding your concern for STD.  You will receive a phone call if anything is positive.  Continue doxycycline.  Abstain from sexual activity for the next 10 days.

## 2022-12-15 ENCOUNTER — Encounter (HOSPITAL_COMMUNITY): Payer: Self-pay | Admitting: Family

## 2022-12-15 ENCOUNTER — Ambulatory Visit (HOSPITAL_BASED_OUTPATIENT_CLINIC_OR_DEPARTMENT_OTHER): Payer: 59 | Admitting: Family

## 2022-12-15 DIAGNOSIS — F4312 Post-traumatic stress disorder, chronic: Secondary | ICD-10-CM

## 2022-12-15 DIAGNOSIS — F4323 Adjustment disorder with mixed anxiety and depressed mood: Secondary | ICD-10-CM

## 2022-12-15 DIAGNOSIS — F331 Major depressive disorder, recurrent, moderate: Secondary | ICD-10-CM

## 2022-12-15 DIAGNOSIS — F3160 Bipolar disorder, current episode mixed, unspecified: Secondary | ICD-10-CM

## 2022-12-15 LAB — GC/CHLAMYDIA PROBE AMP (~~LOC~~) NOT AT ARMC
Chlamydia: POSITIVE — AB
Comment: NEGATIVE
Comment: NORMAL
Neisseria Gonorrhea: NEGATIVE

## 2022-12-15 MED ORDER — HYDROXYZINE PAMOATE 25 MG PO CAPS
25.0000 mg | ORAL_CAPSULE | Freq: Every evening | ORAL | 0 refills | Status: DC | PRN
Start: 2022-12-15 — End: 2023-01-16

## 2022-12-15 MED ORDER — FLUOXETINE HCL 10 MG PO CAPS: 10.0000 mg | ORAL_CAPSULE | Freq: Every day | ORAL | 0 refills | Status: DC

## 2022-12-15 NOTE — Progress Notes (Cosign Needed Addendum)
Psychiatric Initial Adult Assessment   Patient Identification: Wendy Pitts MRN:  [id number] Date of Evaluation:  12/15/2022 Referral Source: Self referral Chief Complaint:  re-establish care   Visit Diagnosis:    ICD-10-CM   1. Major depressive disorder, recurrent episode, moderate (HCC)  F33.1     2. Adjustment disorder with mixed anxiety and depressed mood  F43.23       History of Present Illness: Wendy Pitts 27 year old African-American female presents to reestablish care.  She reports she relocated from [place] 3 years ago to care for her father.  She reports she was last seen and evaluated in 2023 which she was followed by psychiatrist Arfeen.    Reports she is diagnosed with major depressive disorder, generalized anxiety disorder, insomnia and attention deficit disorder.  States she has not taken medications or have follow-up care with therapy or psychiatry services since that time.  She reports she continues to struggle with mood instability, increased depression, paralyzing anxiety/social and passive suicidal ideations.  She is denying plan or intent during this assessment.    Chart review patient was prescribed Wellbutrin, Abilify, Prozac and hydroxyzine.  States she has not taking Wellbutrin as she did not feel that that was helping her symptoms.  States she felt the Wellbutrin made her anxiety worse.  Wendy Pitts states that she was prescribed diazepam 5 mg, Adderall (unknown dose)and Wellbutrin 300 mg.  She denied that she has been tested for attention deficit disorders during her adult years.  States she continues to struggle with poor concentration, anxiety and panic attacks. "  A little blue pill was helpful."  Wendy Pitts states that she is currently employed by [employer].  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  States feeling stressed and overwhelmed having to hear people's stories related to financial issues.  She reports a recent  break-up between she and her significant other for the past 5 years.  States he has moved on and is currently married to someone else.  States she is continues to care for her father whose health has been improving but was declining 2 years ago.  States she has trauma related to " watching them code my father."  States his health has improved but she continues to be primary support for him.  She is denying illicit drug use or substance abuse history.  Denies auditory or visual hallucinations.  Currently denying suicidal or homicidal ideations.  Discussed restarting Prozac and hydroxyzine for mood stabilization.  With plan follow-up x 1 month virtually. Patient had questions related to [facility] services.  Discussed following up with intensive outpatient programming and/or partial hospitalization programming.  Appeared to be receptive to plan   Wendy Pitts is sitting; she is alert/oriented x 3; calm/cooperative; and mood congruent with affect.  Patient is speaking in a clear tone at moderate volume, and normal pace; with good eye contact. Her thought process is coherent and relevant; There is no indication that she is currently responding to internal/external stimuli or experiencing delusional thought content.  Patient denies suicidal/self-harm/homicidal ideation, psychosis, and paranoia.  Patient has remained calm throughout assessment and has answered questions appropriately.    Associated Signs/Symptoms: Depression Symptoms:  depressed mood, feelings of worthlessness/guilt, difficulty concentrating, anxiety, (Hypo) Manic Symptoms:  Irritable Mood, Labiality of Mood, Anxiety Symptoms:  Excessive Worry, Social Anxiety, Psychotic Symptoms:  Hallucinations: None PTSD Symptoms: Re-experiencing:  Flashbacks  Past Psychiatric History: Major depressive disorder, generalized anxiety disorder reported posttraumatic stress disorder.  She denied previous inpatient admissions.  Denied  previous suicide of  attempts does admit to passive ideations denying plan or intent currently. Previous prescribed Wellbutrin, Abilify, hydroxyzine and Prozac.  Stated she has not taking any medications in the past year.  Previous Psychotropic Medications: Yes   Substance Abuse History in the last 12 months:  No.  Consequences of Substance Abuse: NA  Past Medical History:  Past Medical History:  Diagnosis Date   Anxiety    No past surgical history on file.  Family Psychiatric History:   Family History:  Family History  Problem Relation Age of Onset   Bipolar disorder Mother     Social History:   Social History   Socioeconomic History   Marital status: Single    Spouse name: Not on file   Number of children: Not on file   Years of education: Not on file   Highest education level: Not on file  Occupational History   Not on file  Tobacco Use   Smoking status: Never   Smokeless tobacco: Never  Vaping Use   Vaping status: Never Used  Substance and Sexual Activity   Alcohol use: No   Drug use: No   Sexual activity: Yes    Birth control/protection: None  Other Topics Concern   Not on file  Social History Narrative   Not on file   Social Determinants of Health   Financial Resource Strain: Not on file  Food Insecurity: Not on file  Transportation Needs: Not on file  Physical Activity: Not on file  Stress: Not on file  Social Connections: Not on file    Additional Social History:   Allergies:  No Known Allergies  Metabolic Disorder Labs: No results found for: "HGBA1C", "MPG" No results found for: "PROLACTIN" No results found for: "CHOL", "TRIG", "HDL", "CHOLHDL", "VLDL", "LDLCALC" No results found for: "TSH"  Therapeutic Level Labs: No results found for: "LITHIUM" No results found for: "CBMZ" No results found for: "VALPROATE"  Current Medications: Current Outpatient Medications  Medication Sig Dispense Refill   ARIPiprazole (ABILIFY) 2 MG tablet Take 1 tablet (2 mg  total) by mouth daily. (Patient not taking: Reported on 10/02/2021) 30 tablet 0   buPROPion (WELLBUTRIN XL) 300 MG 24 hr tablet Take 1 tablet (300 mg total) by mouth daily. (Patient not taking: Reported on 10/02/2021) 30 tablet 0   doxycycline (VIBRAMYCIN) 100 MG capsule Take 1 capsule (100 mg total) by mouth 2 (two) times daily. 20 capsule 0   FLUoxetine (PROZAC) 10 MG capsule Take 1 capsule (10 mg total) by mouth daily. 60 capsule 0   hydrOXYzine (VISTARIL) 25 MG capsule Take 1 capsule (25 mg total) by mouth at bedtime as needed for anxiety. 20 capsule 0   ibuprofen (ADVIL) 600 MG tablet Take 1 tablet (600 mg total) by mouth every 6 (six) hours as needed. 30 tablet 0   metroNIDAZOLE (FLAGYL) 500 MG tablet Take 1 tablet (500 mg total) by mouth 2 (two) times daily. (Patient not taking: Reported on 09/04/2021) 14 tablet 0   norethindrone (MICRONOR) 0.35 MG tablet Take 1 tablet by mouth daily.     trimethoprim-polymyxin b (POLYTRIM) ophthalmic solution Place 1 drop into the right eye every 4 (four) hours. (Patient not taking: Reported on 09/04/2021) 10 mL 0   No current facility-administered medications for this visit.    Musculoskeletal: Strength & Muscle Tone: within normal limits Gait & Station: normal Patient leans: N/A  Psychiatric Specialty Exam: Review of Systems  Constitutional: Negative.   HENT: Negative.  Negative for  trouble swallowing.   Psychiatric/Behavioral:  Positive for agitation, decreased concentration and sleep disturbance. Negative for self-injury. Suicidal ideas: passive ideations.  All other systems reviewed and are negative.   There were no vitals taken for this visit.There is no height or weight on file to calculate BMI.  General Appearance: Casual  Eye Contact:  Good  Speech:  Clear and Coherent  Volume:  Normal  Mood:  Anxious and Depressed  Affect:  Congruent  Thought Process:  Coherent  Orientation:  Full (Time, Place, and Person)  Thought Content:  Logical   Suicidal Thoughts:  No intermittent passive ideations  Homicidal Thoughts:  No  Memory:  Immediate;   Good Recent;   Good  Judgement:  Good  Insight:  Fair  Psychomotor Activity:  Normal  Concentration:  Concentration: Good  Recall:  Good  Fund of Knowledge:Good  Language: Good  Akathisia:  No  Handed:  Right  AIMS (if indicated):  not done  Assets:  Communication Skills Desire for Improvement Social Support  ADL's:  Intact  Cognition: WNL  Sleep:  Fair   Screenings: Flowsheet Row ED from 12/12/2022 in [facility] Emergency Department at [facility] ED from 01/11/2022 in [facility] from 08/17/2021 in [facility] at [facility]: [facility] RISK CATEGORY No Risk No Risk No Risk       Assessment and Plan:  Argie Gavitt 27 year old African-American female presents to reestablish care.  She carries a diagnosis of major depressive disorder, generalized anxiety disorder, mixed bipolar disorder and posttraumatic stress disorder.  States she was diagnosed with attention deficit disorder during her childhood.  Patient initially reported that she was prescribed Adderall, diazepam and Wellbutrin.  States she was like to be restarted on the Adderall and  diazepam only.  She endorsed mood irritability, social anxiety, increased depression and passive suicidal ideations.  Reports decreased appetite with poor sleeping hygiene.  -Patient recounts similar stories related to having a fear of going to [employer] due to increased social anxiety and having to stay in a car.  States she has attempted to follow-up with therapy however denies that she has a consistent therapist at this time.  He is requesting FMLA due to increased stressors.  Discussed following up with intensive outpatient programming, will restart Prozac 10 mg p.o. daily and a hydroxyzine 25 to 50 mg as needed 3 times daily.  Patient to follow-up for attention deficit disorder testing.  Provided  outpatient resources.   -Patient to follow-up 30 days for medication efficacy and adherence -Will consider nonstimulant such as Strattera or Intuniv or clonidine    Collaboration of Care: Medication Management AEB restarted Prozac and hydroxyzine  Patient/Guardian was advised Release of Information must be obtained prior to any record release in order to collaborate their care with an outside provider. Patient/Guardian was advised if they have not already done so to contact the registration department to sign all necessary forms in order for [place] to release information regarding their care.   Consent: Patient/Guardian gives verbal consent for treatment and assignment of benefits for services provided during this visit. Patient/Guardian expressed understanding and agreed to proceed.   Oneta Rack, NP 9/9/20243:09 PM

## 2023-01-14 ENCOUNTER — Telehealth (HOSPITAL_COMMUNITY): Payer: 59 | Admitting: Family

## 2023-01-14 DIAGNOSIS — F3160 Bipolar disorder, current episode mixed, unspecified: Secondary | ICD-10-CM

## 2023-01-14 MED ORDER — FLUOXETINE HCL 20 MG PO CAPS: 20.0000 mg | ORAL_CAPSULE | Freq: Every day | ORAL | 0 refills | Status: DC

## 2023-01-14 NOTE — Progress Notes (Signed)
Virtual Visit via Video Note  I connected with Wendy Pitts on 01/14/23 at  7:00 AM EDT by a video enabled telemedicine application and verified that I am speaking with the correct person using two identifiers.  Location: Patient: Office Provider: Home   I discussed the limitations of evaluation and management by telemedicine and the availability of in person appointments. The patient expressed understanding and agreed to proceed.    I discussed the assessment and treatment plan with the patient. The patient was provided an opportunity to ask questions and all were answered. The patient agreed with the plan and demonstrated an understanding of the instructions.   The patient was advised to call back or seek an in-person evaluation if the symptoms worsen or if the condition fails to improve as anticipated.  I provided 15 minutes of non-face-to-face time during this encounter.   Oneta Rack, NP   [facility] MD/PA/NP OP Progress Note  01/14/2023 8:34 AM Wendy Pitts  MRN:  [id number]  Chief Complaint: Saray stated " I am doing okay."   HPI: Wendy Pitts is a 27 year old African-American female who was seen and evaluated for monthly progress for medication management.  Patient has been prescribed fluoxetine 10 mg daily and hydroxyzine 25 mg as needed.  She reports some confusion with taking medications.  States she has been taking 2 of fluoxetine to help with her sleep.  States " both of the bottle say may cause sedation."  Education provided with Prozac as an antidepressant and hydroxyzine for anxiety and sleep disturbance.  Discussed titrating Prozac from 10 mg to 20 mg daily and continue hydroxyzine for anxiety/sedation.  She was receptive to plan as she continued to deny suicidal or homicidal ideations.  Denies auditory or visual hallucinations.  States she has been compliant with medications for the most part.  No other concerns noted at this visit.  Continues to report some  apprehension related to going into [employer] but states that her symptoms have improved slightly.  Major depressive disorder: Generalized anxiety disorder:  Increased Fluoxetine 10 mg to 20 mg daily Continue hydroxyzine 25 to 50 mg p.o. as needed 3 times daily    Wendy Pitts is sitting; she is alert/oriented x 4; calm/cooperative; and mood congruent with affect.  Patient is speaking in a clear tone at moderate volume, and normal pace; with good eye contact. Her thought process is coherent and relevant; There is no indication that she is currently responding to internal/external stimuli or experiencing delusional thought content.  Patient denies suicidal/self-harm/homicidal ideation, psychosis, and paranoia.  Patient has remained calm throughout assessment and has answered questions appropriately.    Visit Diagnosis:    ICD-10-CM   1. Mixed bipolar I disorder (HCC)  F31.60       Past Psychiatric History:   Past Medical History:  Past Medical History:  Diagnosis Date   Anxiety    No past surgical history on file.  Family Psychiatric History:   Family History:  Family History  Problem Relation Age of Onset   Bipolar disorder Mother     Social History:  Social History   Socioeconomic History   Marital status: Single    Spouse name: Not on file   Number of children: Not on file   Years of education: Not on file   Highest education level: Not on file  Occupational History   Not on file  Tobacco Use   Smoking status: Never   Smokeless tobacco: Never  Vaping Use   Vaping  status: Never Used  Substance and Sexual Activity   Alcohol use: No   Drug use: No   Sexual activity: Yes    Birth control/protection: None  Other Topics Concern   Not on file  Social History Narrative   Not on file   Social Determinants of Health   Financial Resource Strain: Not on file  Food Insecurity: Not on file  Transportation Needs: Not on file  Physical Activity: Not on file  Stress:  Not on file  Social Connections: Not on file    Allergies: No Known Allergies  Metabolic Disorder Labs: No results found for: "HGBA1C", "MPG" No results found for: "PROLACTIN" No results found for: "CHOL", "TRIG", "HDL", "CHOLHDL", "VLDL", "LDLCALC" No results found for: "TSH"  Therapeutic Level Labs: No results found for: "LITHIUM" No results found for: "VALPROATE" No results found for: "CBMZ"  Current Medications: Current Outpatient Medications  Medication Sig Dispense Refill   FLUoxetine (PROZAC) 20 MG capsule Take 1 capsule (20 mg total) by mouth daily. 60 capsule 0   ARIPiprazole (ABILIFY) 2 MG tablet Take 1 tablet (2 mg total) by mouth daily. (Patient not taking: Reported on 10/02/2021) 30 tablet 0   doxycycline (VIBRAMYCIN) 100 MG capsule Take 1 capsule (100 mg total) by mouth 2 (two) times daily. 20 capsule 0   hydrOXYzine (VISTARIL) 25 MG capsule Take 1 capsule (25 mg total) by mouth at bedtime as needed for anxiety. 20 capsule 0   ibuprofen (ADVIL) 600 MG tablet Take 1 tablet (600 mg total) by mouth every 6 (six) hours as needed. 30 tablet 0   metroNIDAZOLE (FLAGYL) 500 MG tablet Take 1 tablet (500 mg total) by mouth 2 (two) times daily. (Patient not taking: Reported on 09/04/2021) 14 tablet 0   norethindrone (MICRONOR) 0.35 MG tablet Take 1 tablet by mouth daily.     trimethoprim-polymyxin b (POLYTRIM) ophthalmic solution Place 1 drop into the right eye every 4 (four) hours. (Patient not taking: Reported on 09/04/2021) 10 mL 0   No current facility-administered medications for this visit.     Musculoskeletal:   Psychiatric Specialty Exam: Review of Systems  Psychiatric/Behavioral:  Positive for decreased concentration. The patient is nervous/anxious.   All other systems reviewed and are negative.   There were no vitals taken for this visit.There is no height or weight on file to calculate BMI.  General Appearance: Casual  Eye Contact:  Good  Speech:  Clear and  Coherent  Volume:  Normal  Mood:  Anxious and Depressed  Affect:  Congruent  Thought Process:  Coherent  Orientation:  Full (Time, Place, and Person)  Thought Content: Logical   Suicidal Thoughts:  No  Homicidal Thoughts:  No  Memory:  Immediate;   Good Recent;   Good  Judgement:  Good  Insight:  Good  Psychomotor Activity:  Normal  Concentration:  Concentration: Good  Recall:  Good  Fund of Knowledge: Good  Language: Good  Akathisia:  No  Handed:  Right  AIMS (if indicated): done  Assets:  Communication Skills Desire for Improvement Resilience Social Support  ADL's:  Intact  Cognition: WNL  Sleep:  Fair   Screenings: PHQ2-9    Flowsheet Row [facility]15/[facility]  PHQ-2 Total Score 4  PHQ-9 Total Score 13      Flowsheet Row Office Visit from 12/15/2022 in [facility] from 12/12/2022 in [facility] Emergency Department at [facility] ED from 01/11/2022 in  [facility] RISK CATEGORY Error: Q7 should not be populated when Q6 is No No Risk No Risk        Assessment and Plan: Wendy Pitts 27 year old African-American female presents for medication adherence/tolerability related.  She reports taking and tolerating medications well.  Some confusion with taking medications as directed.  Will prescribe Prozac 20 mg daily and continue hydroxyzine 25 to 50 mg as needed.  She was receptive to plan.  Patient to follow-up 3 months.   Collaboration of Care: Collaboration of Care: Medication Management AEB increase Prozac from 10 mg to 20 mg daily  Patient/Guardian was advised Release of Information must be obtained prior to any record release in order to collaborate their care with an outside provider. Patient/Guardian was advised if they have not already done so to contact the registration department to sign all necessary forms in order for  [place] to release information regarding their care.   Consent: Patient/Guardian gives verbal consent for treatment and assignment of benefits for services provided during this visit. Patient/Guardian expressed understanding and agreed to proceed.    Oneta Rack, NP 01/14/2023, 8:34 AM

## 2023-01-16 ENCOUNTER — Other Ambulatory Visit (HOSPITAL_COMMUNITY): Payer: Self-pay

## 2023-01-16 DIAGNOSIS — F4312 Post-traumatic stress disorder, chronic: Secondary | ICD-10-CM

## 2023-01-16 MED ORDER — HYDROXYZINE PAMOATE 25 MG PO CAPS: 25.0000 mg | ORAL_CAPSULE | Freq: Three times a day (TID) | ORAL | 0 refills | Status: DC

## 2023-05-18 ENCOUNTER — Encounter (HOSPITAL_COMMUNITY): Payer: Self-pay

## 2023-05-18 ENCOUNTER — Emergency Department (HOSPITAL_COMMUNITY)
Admission: EM | Admit: 2023-05-18 | Discharge: 2023-05-19 | Disposition: A | Discharge status: Other Acute Inpatient Hospital | Payer: 59 | Source: Home / Self Care | Attending: Emergency Medicine | Admitting: Emergency Medicine

## 2023-05-18 ENCOUNTER — Other Ambulatory Visit: Payer: Self-pay

## 2023-05-18 DIAGNOSIS — R45851 Suicidal ideations: Secondary | ICD-10-CM | POA: Insufficient documentation

## 2023-05-18 DIAGNOSIS — T50902A Poisoning by unspecified drugs, medicaments and biological substances, intentional self-harm, initial encounter: Secondary | ICD-10-CM | POA: Insufficient documentation

## 2023-05-18 DIAGNOSIS — F319 Bipolar disorder, unspecified: Secondary | ICD-10-CM | POA: Insufficient documentation

## 2023-05-18 DIAGNOSIS — F909 Attention-deficit hyperactivity disorder, unspecified type: Secondary | ICD-10-CM | POA: Insufficient documentation

## 2023-05-18 HISTORY — DX: Bipolar disorder, unspecified: F31.9

## 2023-05-18 LAB — COMPREHENSIVE METABOLIC PANEL
ALT: 22 U/L (ref 0–44)
AST: 20 U/L (ref 15–41)
Albumin: 3.6 g/dL (ref 3.5–5.0)
Alkaline Phosphatase: 76 U/L (ref 38–126)
Anion gap: 8 (ref 5–15)
BUN: 11 mg/dL (ref 6–20)
CO2: 23 mmol/L (ref 22–32)
Calcium: 8.9 mg/dL (ref 8.9–10.3)
Chloride: 106 mmol/L (ref 98–111)
Creatinine, Ser: 0.84 mg/dL (ref 0.44–1.00)
GFR, Estimated: >60 mL/min (ref >60)
Glucose, Bld: 90 mg/dL (ref 70–99)
Potassium: 4.1 mmol/L (ref 3.5–5.1)
Sodium: 137 mmol/L (ref 135–145)
Total Bilirubin: 0.6 mg/dL (ref 0.0–1.2)
Total Protein: 7.4 g/dL (ref 6.5–8.1)

## 2023-05-18 LAB — CBC WITH DIFFERENTIAL/PLATELET
Abs Immature Granulocytes: 0.03 10*3/uL (ref 0.00–0.07)
Basophils Absolute: 0 10*3/uL (ref 0.0–0.1)
Basophils Relative: 0 %
Eosinophils Absolute: 0.2 10*3/uL (ref 0.0–0.5)
Eosinophils Relative: 2 %
HCT: 36.7 % (ref 36.0–46.0)
Hemoglobin: 11.6 g/dL — ABNORMAL LOW (ref 12.0–15.0)
Immature Granulocytes: 0 %
Lymphocytes Relative: 25 %
Lymphs Abs: 2 10*3/uL (ref 0.7–4.0)
MCH: 30.5 pg (ref 26.0–34.0)
MCHC: 31.6 g/dL (ref 30.0–36.0)
MCV: 96.6 fL (ref 80.0–100.0)
Monocytes Absolute: 0.7 10*3/uL (ref 0.1–1.0)
Monocytes Relative: 9 %
Neutro Abs: 5.2 10*3/uL (ref 1.7–7.7)
Neutrophils Relative %: 64 %
Platelets: 310 10*3/uL (ref 150–400)
RBC: 3.8 MIL/uL — ABNORMAL LOW (ref 3.87–5.11)
RDW: 14.9 % (ref 11.5–15.5)
WBC: 8.1 10*3/uL (ref 4.0–10.5)
nRBC: 0 % (ref 0.0–0.2)

## 2023-05-18 LAB — HCG, SERUM, QUALITATIVE: Preg, Serum: NEGATIVE

## 2023-05-18 LAB — ETHANOL: Alcohol, Ethyl (B): <10 mg/dL (ref <10)

## 2023-05-18 LAB — ACETAMINOPHEN LEVEL: Acetaminophen (Tylenol), Serum: <10 ug/mL — ABNORMAL LOW (ref 10–30)

## 2023-05-18 LAB — SALICYLATE LEVEL: Salicylate Lvl: <7 mg/dL — ABNORMAL LOW (ref 7.0–30.0)

## 2023-05-18 NOTE — BH Assessment (Signed)
 TTS clinician attempted to contact [facility] 2x, no answer.  TTS consult will be completed by [facility]. [facility] Coordinator will communicate in established secure chat assessment time and provider name.

## 2023-05-18 NOTE — ED Provider Notes (Signed)
 [facility] AT [facility] Provider Note   CSN: [id number] Arrival date & time: 05/18/23  2029     History  Chief Complaint  Patient presents with   Suicide Attempt    Wendy Pitts is a 28 y.o. female.  Pt is a 28 yo female with pmhx significant for depression, anxiety, and bipolar d/o.  Tonight, she was feeling suicidal and took 10-15 ibuprofen  pills.  She did call EMS herself.  She initially told triage that she took tylenol , but she showed me the pill bottle and it was ibuprofen .       Home Medications Prior to Admission medications   Medication Sig Start Date End Date Taking? Authorizing Provider  ARIPiprazole  (ABILIFY ) 2 MG tablet Take 1 tablet (2 mg total) by mouth daily. Patient not taking: Reported on 10/02/2021 09/04/21   Arfeen, Bronson Canny, MD  doxycycline  (VIBRAMYCIN ) 100 MG capsule Take 1 capsule (100 mg total) by mouth 2 (two) times daily. 12/13/22   Horton, Vonzella Guernsey, MD  FLUoxetine  (PROZAC ) 20 MG capsule Take 1 capsule (20 mg total) by mouth daily. 01/14/23   Levester Reagin, NP  hydrOXYzine  (VISTARIL ) 25 MG capsule Take 1 capsule (25 mg total) by mouth 3 (three) times daily. As needed for anxiety 01/16/23   Levester Reagin, NP  ibuprofen  (ADVIL ) 600 MG tablet Take 1 tablet (600 mg total) by mouth every 6 (six) hours as needed. 12/13/22   Horton, Vonzella Guernsey, MD  metroNIDAZOLE  (FLAGYL ) 500 MG tablet Take 1 tablet (500 mg total) by mouth 2 (two) times daily. Patient not taking: Reported on 09/04/2021 08/20/21   Corine Dice, MD  norethindrone (MICRONOR) 0.35 MG tablet Take 1 tablet by mouth daily. 12/21/21   [provider]  trimethoprim -polymyxin b  (POLYTRIM ) ophthalmic solution Place 1 drop into the right eye every 4 (four) hours. Patient not taking: Reported on 09/04/2021 04/24/21   Blinda Burger, NP      Allergies    Patient has no known allergies.    Review of Systems   Review of Systems  Psychiatric/Behavioral:  Positive  for suicidal ideas.   All other systems reviewed and are negative.   Physical Exam Updated Vital Signs BP (!) 98/52 (BP Location: Right Arm)   Pulse 74   Temp 98.4 F (36.9 C) (Oral)   Resp 17   Ht 5\' 4"  (1.626 m)   Wt (!) 147.9 kg   SpO2 97%   BMI 55.96 kg/m  Physical Exam Vitals and nursing note reviewed.  Constitutional:      Appearance: Normal appearance.  HENT:     Head: Normocephalic and atraumatic.     Right Ear: External ear normal.     Left Ear: External ear normal.     Nose: Nose normal.     Mouth/Throat:     Mouth: Mucous membranes are moist.     Pharynx: Oropharynx is clear.  Eyes:     Extraocular Movements: Extraocular movements intact.     Conjunctiva/sclera: Conjunctivae normal.     Pupils: Pupils are equal, round, and reactive to light.  Cardiovascular:     Rate and Rhythm: Normal rate and regular rhythm.     Pulses: Normal pulses.     Heart sounds: Normal heart sounds.  Pulmonary:     Effort: Pulmonary effort is normal.     Breath sounds: Normal breath sounds.  Abdominal:     General: Abdomen is flat. Bowel sounds are normal.  Palpations: Abdomen is soft.  Musculoskeletal:        General: Normal range of motion.     Cervical back: Normal range of motion and neck supple.  Skin:    General: Skin is warm.     Capillary Refill: Capillary refill takes less than 2 seconds.  Neurological:     General: No focal deficit present.     Mental Status: She is alert and oriented to person, place, and time.  Psychiatric:        Mood and Affect: Affect is tearful.        Thought Content: Thought content includes suicidal ideation.     ED Results / Procedures / Treatments   Labs (all labs ordered are listed, but only abnormal results are displayed) Labs Reviewed  SALICYLATE LEVEL - Abnormal; Notable for the following components:      Result Value   Salicylate Lvl <7.0 (*)    All other components within normal limits  ACETAMINOPHEN  LEVEL - Abnormal;  Notable for the following components:   Acetaminophen  (Tylenol ), Serum <10 (*)    All other components within normal limits  CBC WITH DIFFERENTIAL/PLATELET - Abnormal; Notable for the following components:   RBC 3.80 (*)    Hemoglobin 11.6 (*)    All other components within normal limits  COMPREHENSIVE METABOLIC PANEL  ETHANOL  RAPID URINE DRUG SCREEN, HOSP PERFORMED  HCG, SERUM, QUALITATIVE  CBG MONITORING, ED    EKG None  Radiology No results found.  Procedures Procedures    Medications Ordered in ED Medications - No data to display  ED Course/ Medical Decision Making/ A&P                                 Medical Decision Making Amount and/or Complexity of Data Reviewed Labs: ordered.   This patient presents to the ED for concern of drug overdose, this involves an extensive number of treatment options, and is a complaint that carries with it a high risk of complications and morbidity.  The differential diagnosis includes drug od, electrolyte abn   Co morbidities that complicate the patient evaluation  depression, anxiety, and bipolar d/o   Additional history obtained:  Additional history obtained from epic chart review External records from outside source obtained and reviewed including EMS report   Lab Tests:  I Ordered, and personally interpreted labs.  The pertinent results include:  cbc with hgb 11.6 (stable), cmp nl, salicylate level neg, acet level neg, etoh neg  Medicines ordered and prescription drug management:   I have reviewed the patients home medicines and have made adjustments as needed  Critical Interventions:  TTS consult   Consultations Obtained:  I requested consultation with TTS,  and discussed lab and imaging findings as well as pertinent plan - consult pending  Problem List / ED Course:  Intentional drug od:  luckily, this patient took ibuprofen  and is medically clear.  She is suicidal, so will need TTS to see her.      Reevaluation:  After the interventions noted above, I reevaluated the patient and found that they have :improved   Social Determinants of Health:  Lives at home   Dispostion:  Pending TTS consult        Final Clinical Impression(s) / ED Diagnoses Final diagnoses:  Suicidal ideation  Intentional drug overdose, initial encounter [facility])    Rx / DC Orders ED Discharge Orders  None         Sueellen Emery, MD 05/18/23 2219

## 2023-05-18 NOTE — ED Triage Notes (Signed)
 Patient BIB [facility] after taking 15 800 mg Tylenol  pills one hour PTA.  Patient states she called the crisis hotline and then took 7 pills.  Before EMS arrived, she took 8 more pills.  Patient states she had attempted suicide in the past by cutting.  Patient is alert on arrival, no emesis, denies pain

## 2023-05-19 ENCOUNTER — Encounter (HOSPITAL_COMMUNITY): Payer: Self-pay | Admitting: Psychiatry

## 2023-05-19 ENCOUNTER — Encounter (HOSPITAL_COMMUNITY): Payer: Self-pay | Admitting: Nurse Practitioner

## 2023-05-19 ENCOUNTER — Inpatient Hospital Stay (HOSPITAL_COMMUNITY)
Admission: AD | Admit: 2023-05-19 | Discharge: 2023-05-25 | DRG: 885 | Disposition: A | Discharge status: Home / Self Care | Payer: 59 | Source: Intra-hospital | Attending: Psychiatry | Admitting: Psychiatry

## 2023-05-19 DIAGNOSIS — F4310 Post-traumatic stress disorder, unspecified: Secondary | ICD-10-CM

## 2023-05-19 DIAGNOSIS — F3130 Bipolar disorder, current episode depressed, mild or moderate severity, unspecified: Secondary | ICD-10-CM | POA: Diagnosis present

## 2023-05-19 DIAGNOSIS — T39312A Poisoning by propionic acid derivatives, intentional self-harm, initial encounter: Secondary | ICD-10-CM | POA: Diagnosis present

## 2023-05-19 DIAGNOSIS — Y92009 Unspecified place in unspecified non-institutional (private) residence as the place of occurrence of the external cause: Secondary | ICD-10-CM

## 2023-05-19 DIAGNOSIS — F410 Panic disorder [episodic paroxysmal anxiety] without agoraphobia: Secondary | ICD-10-CM | POA: Diagnosis present

## 2023-05-19 DIAGNOSIS — Z560 Unemployment, unspecified: Secondary | ICD-10-CM

## 2023-05-19 DIAGNOSIS — Z6841 Body Mass Index (BMI) 40.0 and over, adult: Secondary | ICD-10-CM | POA: Diagnosis not present

## 2023-05-19 DIAGNOSIS — Z5900 Homelessness unspecified: Secondary | ICD-10-CM

## 2023-05-19 DIAGNOSIS — Z91411 Personal history of adult psychological abuse: Secondary | ICD-10-CM | POA: Diagnosis not present

## 2023-05-19 DIAGNOSIS — Z5982 Transportation insecurity: Secondary | ICD-10-CM | POA: Diagnosis not present

## 2023-05-19 DIAGNOSIS — F319 Bipolar disorder, unspecified: Secondary | ICD-10-CM

## 2023-05-19 DIAGNOSIS — Z5948 Other specified lack of adequate food: Secondary | ICD-10-CM | POA: Diagnosis not present

## 2023-05-19 DIAGNOSIS — F902 Attention-deficit hyperactivity disorder, combined type: Secondary | ICD-10-CM | POA: Diagnosis not present

## 2023-05-19 DIAGNOSIS — Z79899 Other long term (current) drug therapy: Secondary | ICD-10-CM | POA: Diagnosis not present

## 2023-05-19 DIAGNOSIS — E663 Overweight: Secondary | ICD-10-CM | POA: Diagnosis present

## 2023-05-19 DIAGNOSIS — Z793 Long term (current) use of hormonal contraceptives: Secondary | ICD-10-CM | POA: Diagnosis not present

## 2023-05-19 DIAGNOSIS — Z5941 Food insecurity: Secondary | ICD-10-CM

## 2023-05-19 DIAGNOSIS — Z818 Family history of other mental and behavioral disorders: Secondary | ICD-10-CM

## 2023-05-19 DIAGNOSIS — Z62810 Personal history of physical and sexual abuse in childhood: Secondary | ICD-10-CM

## 2023-05-19 DIAGNOSIS — Z9152 Personal history of nonsuicidal self-harm: Secondary | ICD-10-CM

## 2023-05-19 DIAGNOSIS — F909 Attention-deficit hyperactivity disorder, unspecified type: Secondary | ICD-10-CM | POA: Diagnosis present

## 2023-05-19 DIAGNOSIS — F603 Borderline personality disorder: Secondary | ICD-10-CM | POA: Diagnosis present

## 2023-05-19 LAB — RAPID URINE DRUG SCREEN, HOSP PERFORMED
Amphetamines: NOT DETECTED
Barbiturates: NOT DETECTED
Benzodiazepines: NOT DETECTED
Cocaine: NOT DETECTED
Opiates: NOT DETECTED
Tetrahydrocannabinol: POSITIVE — AB

## 2023-05-19 LAB — BASIC METABOLIC PANEL
Anion gap: 8 (ref 5–15)
BUN: 12 mg/dL (ref 6–20)
CO2: 22 mmol/L (ref 22–32)
Calcium: 8.9 mg/dL (ref 8.9–10.3)
Chloride: 106 mmol/L (ref 98–111)
Creatinine, Ser: 0.75 mg/dL (ref 0.44–1.00)
GFR, Estimated: >60 mL/min (ref >60)
Glucose, Bld: 94 mg/dL (ref 70–99)
Potassium: 3.8 mmol/L (ref 3.5–5.1)
Sodium: 136 mmol/L (ref 135–145)

## 2023-05-19 LAB — ACETAMINOPHEN LEVEL: Acetaminophen (Tylenol), Serum: <10 ug/mL — ABNORMAL LOW (ref 10–30)

## 2023-05-19 LAB — SALICYLATE LEVEL: Salicylate Lvl: <7 mg/dL — ABNORMAL LOW (ref 7.0–30.0)

## 2023-05-19 LAB — CBG MONITORING, ED: Glucose-Capillary: 101 mg/dL — ABNORMAL HIGH (ref 70–99)

## 2023-05-19 MED ORDER — HYDROXYZINE HCL 10 MG PO TABS
25.0000 mg | ORAL_TABLET | Freq: Three times a day (TID) | ORAL | Status: DC
  Administered 2023-05-19 (×2): 25 mg via ORAL
  Filled 2023-05-19 (×7): qty 3

## 2023-05-19 MED ORDER — DIPHENHYDRAMINE HCL 25 MG PO CAPS: 50.0000 mg | ORAL_CAPSULE | Freq: Three times a day (TID) | ORAL | Status: DC | PRN

## 2023-05-19 MED ORDER — ACETAMINOPHEN 325 MG PO TABS
650.0000 mg | ORAL_TABLET | Freq: Four times a day (QID) | ORAL | Status: DC | PRN
  Administered 2023-05-19 – 2023-05-23 (×4): 650 mg via ORAL
  Filled 2023-05-19 (×4): qty 2

## 2023-05-19 MED ORDER — HALOPERIDOL LACTATE 5 MG/ML IJ SOLN: 5.0000 mg | Freq: Three times a day (TID) | INTRAMUSCULAR | Status: DC | PRN

## 2023-05-19 MED ORDER — DIPHENHYDRAMINE HCL 50 MG/ML IJ SOLN: 50.0000 mg | Freq: Three times a day (TID) | INTRAMUSCULAR | Status: DC | PRN

## 2023-05-19 MED ORDER — LURASIDONE HCL 40 MG PO TABS
40.0000 mg | ORAL_TABLET | Freq: Every day | ORAL | Status: DC
  Administered 2023-05-19 – 2023-05-20 (×2): 40 mg via ORAL
  Filled 2023-05-19 (×4): qty 1

## 2023-05-19 MED ORDER — HALOPERIDOL 5 MG PO TABS: 5.0000 mg | ORAL_TABLET | Freq: Three times a day (TID) | ORAL | Status: DC | PRN

## 2023-05-19 MED ORDER — HALOPERIDOL LACTATE 5 MG/ML IJ SOLN
10.0000 mg | Freq: Three times a day (TID) | INTRAMUSCULAR | Status: DC | PRN
Start: 2023-05-19 — End: 2023-05-25

## 2023-05-19 MED ORDER — ONDANSETRON HCL 4 MG/2ML IJ SOLN
4.0000 mg | Freq: Once | INTRAMUSCULAR | Status: AC
  Administered 2023-05-19: 4 mg via INTRAVENOUS
  Filled 2023-05-19: qty 2

## 2023-05-19 MED ORDER — LORAZEPAM 2 MG/ML IJ SOLN: 2.0000 mg | Freq: Three times a day (TID) | INTRAMUSCULAR | Status: DC | PRN

## 2023-05-19 MED ORDER — FLUOXETINE HCL 20 MG PO CAPS
20.0000 mg | ORAL_CAPSULE | Freq: Every day | ORAL | Status: DC
  Administered 2023-05-19 – 2023-05-25 (×7): 20 mg via ORAL
  Filled 2023-05-19 (×10): qty 1

## 2023-05-19 MED ORDER — MAGNESIUM HYDROXIDE 400 MG/5ML PO SUSP
30.0000 mL | Freq: Every day | ORAL | Status: DC | PRN
  Administered 2023-05-19 – 2023-05-23 (×2): 30 mL via ORAL
  Filled 2023-05-19 (×2): qty 30

## 2023-05-19 MED ORDER — ALUM & MAG HYDROXIDE-SIMETH 200-200-20 MG/5ML PO SUSP: 30.0000 mL | ORAL | Status: DC | PRN

## 2023-05-19 NOTE — ED Notes (Signed)
Patient to room 31 per charge.  Patient ambulated to room.   Patient tearful.  Patient oriented to unit and room.

## 2023-05-19 NOTE — Plan of Care (Signed)
Problem: Education: Goal: Knowledge of [facility] Education information/materials will improve Outcome: Progressing Goal: Verbalization of understanding the information provided will improve Outcome: Progressing

## 2023-05-19 NOTE — [facility] Group Notes (Signed)
[facility] Group Notes:  (Nursing/MHT/Case Management/Adjunct)  Date:  05/19/2023  Time:  2000  Type of Therapy:   wrap up group  Participation Level:  Active  Participation Quality:  Appropriate and Attentive  Affect:  Appropriate  Cognitive:  Alert and Appropriate  Insight:  Appropriate  Engagement in Group:  none  Modes of Intervention:  Discussion  Summary of Progress/Problems:  Fay Records 05/19/2023, 8:26 PM

## 2023-05-19 NOTE — ED Notes (Signed)
Report given to [place], [place]

## 2023-05-19 NOTE — Progress Notes (Signed)
Pt has been accepted to [facility] on 05/19/2023 Bed assignment: 300-1   Pt meets inpatient criteria per: Jacquiline Doe NP  Attending Physician will be: Phineas Inches, MD   Report can be called to: Adult unit: [phone]  Pt can arrive after 9:30 AM   Care Team Notified: [facility] Rosey Bath RN, Lum Babe RN, Jacquelynn Cree NT, [place] Fiscus RN    [place] Xachary Hambly LCSW-A   05/19/2023 8:52 AM

## 2023-05-19 NOTE — ED Notes (Signed)
Poison called , RN spoke with Wendy Pitts who advise we repeat tylenol level, and bmp, and continue to monitor pt until she calls back in an hour to get new lab results.

## 2023-05-19 NOTE — ED Notes (Signed)
Patient off unit to [facility] per provider. Patient alert, calm, cooperative, no s//s of distress. Discharge information and belongings given to [facility] for facility. Patient ambulatory off unit, escorted by RN. Patient transported by [employer].

## 2023-05-19 NOTE — Group Note (Signed)
LCSW Group Therapy Note   Group Date: 05/19/2023 Start Time: 1100 End Time: 1200   Participation:  She had not been admitted yet when the group was held  Title:  Healing Hearts: A Oncologist for Grief  Objective: The objective of this class is to create a compassionate environment where participants can process their grief, explore different stages of grief, and discover ways to honor their loved ones through personal rituals. 3 Goals: Provide a safe and supportive space where participants feel comfortable sharing their feelings and experiences of grief without judgment. Educate participants about the stages of grief and emphasize that there is no "right" way to grieve or a fixed timeline for healing. Introduce the concept of rituals as a means to process grief, allowing individuals to honor their loved ones in a personal and meaningful way.  Summary: In Healing Hearts: A Safe Space for Grief, we explored the unique and personal journey of grief, emphasizing that everyone experiences it differently.  We discussed the five stages of grief (denial, anger, bargaining, depression, and acceptance), with the understanding that grief is not linear.  Rituals were introduced as a way to help cope with loss, offering comfort and connection through meaningful actions such as lighting candles or taking memory walks.  Participants were encouraged to express their emotions, focus on self-care, and reflect on moments of gratitude for their loved ones, recognizing that healing is a process and there is no timeline for grief.  Therapeutic Modalities:  Elements of CBT (cognitive restructuring)  Elements of DBT (mindfulness, distress tolerance, radical acceptance)    Wendy Pitts, LCSWA 05/19/2023  12:14 PM

## 2023-05-19 NOTE — ED Notes (Signed)
Pt has been changed out to purple scrubs, all belongings placed in one bag, with 2 pat labels, pt cut off Iphone to save battery, pt purse also in bag, belongings are currently in 23-25/C cabinet, right across from hallway c where pt is currently at

## 2023-05-19 NOTE — Progress Notes (Signed)
   05/19/23 2100  Psych Admission Type (Psych Patients Only)  Admission Status Voluntary  Psychosocial Assessment  Patient Complaints Anxiety;Depression;Sadness;Worrying  Eye Contact Fair  Facial Expression Flat  Affect Appropriate to circumstance  Speech Logical/coherent  Interaction Assertive  Motor Activity Slow  Appearance/Hygiene Unremarkable  Behavior Characteristics Cooperative;Anxious  Mood Anxious;Depressed  Thought Process  Coherency WDL  Content Blaming others  Delusions None reported or observed  Perception WDL  Hallucination None reported or observed  Judgment Impaired  Confusion None  Danger to Self  Current suicidal ideation? Passive  Self-Injurious Behavior Some self-injurious ideation observed or expressed.  No lethal plan expressed   Agreement Not to Harm Self Yes  Description of Agreement Verbal  Danger to Others  Danger to Others None reported or observed
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
## 2023-05-20 ENCOUNTER — Encounter (HOSPITAL_COMMUNITY): Payer: Self-pay

## 2023-05-20 DIAGNOSIS — F319 Bipolar disorder, unspecified: Secondary | ICD-10-CM | POA: Diagnosis not present

## 2023-05-20 MED ORDER — PRAZOSIN HCL 2 MG PO CAPS
2.0000 mg | ORAL_CAPSULE | Freq: Every day | ORAL | Status: DC
  Administered 2023-05-20 – 2023-05-24 (×5): 2 mg via ORAL
  Filled 2023-05-20 (×7): qty 1

## 2023-05-20 MED ORDER — HYDROXYZINE HCL 25 MG PO TABS
25.0000 mg | ORAL_TABLET | Freq: Three times a day (TID) | ORAL | Status: DC | PRN
Start: 2023-05-20 — End: 2023-05-25
  Administered 2023-05-20 – 2023-05-25 (×13): 25 mg via ORAL
  Filled 2023-05-20 (×13): qty 1

## 2023-05-20 NOTE — Plan of Care (Signed)
Problem: Education: Goal: Emotional status will improve Outcome: Progressing Goal: Mental status will improve Outcome: Progressing   Problem: Activity: Goal: Interest or engagement in activities will improve Outcome: Progressing Goal: Sleeping patterns will improve Outcome: Progressing

## 2023-05-20 NOTE — Plan of Care (Signed)
  Problem: Education: Goal: Emotional status will improve Outcome: Progressing Goal: Mental status will improve Outcome: Progressing   Problem: Self-Concept: Goal: Level of anxiety will decrease Outcome: Progressing

## 2023-05-20 NOTE — BH IP Treatment Plan (Signed)
Interdisciplinary Treatment and Diagnostic Plan Update  05/20/2023 Time of Session: 11:15a Wendy Pitts MRN: [id number]  Principal Diagnosis: Bipolar 1 disorder, depressed (HCC)  Secondary Diagnoses: Principal Problem:   Bipolar 1 disorder, depressed (HCC)   Current Medications:  Current Facility-Administered Medications  Medication Dose Route Frequency Provider Last Rate Last Admin   acetaminophen (TYLENOL) tablet 650 mg  650 mg Oral Q6H PRN Dahlia Byes C, NP   650 mg at 05/19/23 2051   alum & mag hydroxide-simeth (MAALOX/MYLANTA) 200-200-20 MG/5ML suspension 30 mL  30 mL Oral Q4H PRN Onuoha, Josephine C, NP       haloperidol (HALDOL) tablet 5 mg  5 mg Oral TID PRN Dahlia Byes C, NP       And   diphenhydrAMINE (BENADRYL) capsule 50 mg  50 mg Oral TID PRN Dahlia Byes C, NP       haloperidol lactate (HALDOL) injection 5 mg  5 mg Intramuscular TID PRN Dahlia Byes C, NP       And   diphenhydrAMINE (BENADRYL) injection 50 mg  50 mg Intramuscular TID PRN Dahlia Byes C, NP       And   LORazepam (ATIVAN) injection 2 mg  2 mg Intramuscular TID PRN Dahlia Byes C, NP       haloperidol lactate (HALDOL) injection 10 mg  10 mg Intramuscular TID PRN Dahlia Byes C, NP       And   diphenhydrAMINE (BENADRYL) injection 50 mg  50 mg Intramuscular TID PRN Dahlia Byes C, NP       And   LORazepam (ATIVAN) injection 2 mg  2 mg Intramuscular TID PRN Dahlia Byes C, NP       FLUoxetine (PROZAC) capsule 20 mg  20 mg Oral Daily Onuoha, Josephine C, NP   20 mg at 05/20/23 0729   hydrOXYzine (ATARAX) tablet 25 mg  25 mg Oral TID PRN Onuoha, Chinwendu V, NP   25 mg at 05/20/23 0541   lurasidone (LATUDA) tablet 40 mg  40 mg Oral QHS Izediuno, Vincent A, MD   40 mg at 05/19/23 2105   magnesium hydroxide (MILK OF MAGNESIA) suspension 30 mL  30 mL Oral Daily PRN Dahlia Byes C, NP   30 mL at 05/19/23 2051   PTA Medications: No medications prior to  admission.    Patient Stressors: Marital or family conflict   Occupational concerns   Substance abuse    Patient Strengths: Motivation for treatment/growth   Treatment Modalities: Medication Management, Group therapy, Case management,  1 to 1 session with clinician, Psychoeducation, Recreational therapy.   Physician Treatment Plan for Primary Diagnosis: Bipolar 1 disorder, depressed (HCC) Long Term Goal(s):     Short Term Goals:    Medication Management: Evaluate patient's response, side effects, and tolerance of medication regimen.  Therapeutic Interventions: 1 to 1 sessions, Unit Group sessions and Medication administration.  Evaluation of Outcomes: Not Progressing  Physician Treatment Plan for Secondary Diagnosis: Principal Problem:   Bipolar 1 disorder, depressed (HCC)  Long Term Goal(s):     Short Term Goals:       Medication Management: Evaluate patient's response, side effects, and tolerance of medication regimen.  Therapeutic Interventions: 1 to 1 sessions, Unit Group sessions and Medication administration.  Evaluation of Outcomes: Not Progressing   RN Treatment Plan for Primary Diagnosis: Bipolar 1 disorder, depressed (HCC) Long Term Goal(s): Knowledge of disease and therapeutic regimen to maintain health will improve  Short Term Goals: Ability to remain free from injury will  improve, Ability to verbalize frustration and anger appropriately will improve, Ability to demonstrate self-control, Ability to participate in decision making will improve, Ability to verbalize feelings will improve, Ability to disclose and discuss suicidal ideas, Ability to identify and develop effective coping behaviors will improve, and Compliance with prescribed medications will improve  Medication Management: RN will administer medications as ordered by provider, will assess and evaluate patient's response and provide education to patient for prescribed medication. RN will report any  adverse and/or side effects to prescribing provider.  Therapeutic Interventions: 1 on 1 counseling sessions, Psychoeducation, Medication administration, Evaluate responses to treatment, Monitor vital signs and CBGs as ordered, Perform/monitor CIWA, COWS, AIMS and Fall Risk screenings as ordered, Perform wound care treatments as ordered.  Evaluation of Outcomes: Not Progressing   LCSW Treatment Plan for Primary Diagnosis: Bipolar 1 disorder, depressed (HCC) Long Term Goal(s): Safe transition to appropriate next level of care at discharge, Engage patient in therapeutic group addressing interpersonal concerns.  Short Term Goals: Engage patient in aftercare planning with referrals and resources, Increase social support, Increase ability to appropriately verbalize feelings, Increase emotional regulation, Facilitate acceptance of mental health diagnosis and concerns, Facilitate patient progression through stages of change regarding substance use diagnoses and concerns, Identify triggers associated with mental health/substance abuse issues, and Increase skills for wellness and recovery  Therapeutic Interventions: Assess for all discharge needs, 1 to 1 time with Social worker, Explore available resources and support systems, Assess for adequacy in community support network, Educate family and significant other(s) on suicide prevention, Complete Psychosocial Assessment, Interpersonal group therapy.  Evaluation of Outcomes: Not Progressing   Progress in Treatment: Attending groups: Yes. Participating in groups: Yes. Taking medication as prescribed: Yes. Toleration medication: Yes. Family/Significant other contact made: No, will contact:  CSW will make contact Patient understands diagnosis: Yes. Discussing patient identified problems/goals with staff: Yes. Medical problems stabilized or resolved: Yes. Denies suicidal/homicidal ideation: Yes. Issues/concerns per patient self-inventory: No. Other:  n/a  New problem(s) identified: No, Describe:  n/a  New Short Term/Long Term Goal(s): detox, medication management for mood stabilization; elimination of SI thoughts; development of comprehensive mental wellness/sobriety plan  OR   medication stabilization, elimination of SI thoughts, development of comprehensive mental wellness plan.    Patient Goals:  "to be assesed so I can be on proper medicine"  Discharge Plan or Barriers: Patient recently admitted. CSW will continue to follow and assess for appropriate referrals and possible discharge planning.    Reason for Continuation of Hospitalization: Depression  Estimated Length of Stay: 3-5 days  Last 3 [place] Suicide Severity Risk Score: Flowsheet Row Ad[facility] (Current) [facility]18/2023 in [facility]  C-SSRS RISK CATEGORY High Risk High Risk Error: Q7 should not be populated when Q6 is No       Last PHQ 2/9 Scores:    12/15/2022    3:11 PM  Depression screen PHQ 2/9  Decreased Interest 2  Down, Depressed, Hopeless 2  PHQ - 2 Score 4  Altered sleeping 1  Tired, decreased energy 2  Change in appetite 1  Feeling bad or failure about yourself  2  Trouble concentrating 1  Moving slowly or fidgety/restless 1  Suicidal thoughts 1  PHQ-9 Score 13  Difficult doing work/chores Somewhat difficult    Scribe for Treatment Team: Marinda Elk, LCSW 05/20/2023 11:51 AM

## 2023-05-20 NOTE — Group Note (Signed)
Date:  05/20/2023 Time:  9:24 PM  Group Topic/Focus:  NA (Narcotics Anonymous) Meeting    Participation Level:  Did Not Attend  Participation Quality:   N/A  Affect:   N/A  Cognitive:   N/A  Insight: None  Engagement in Group:   N/A  Modes of Intervention:   N/A  Additional Comments:  Patient did not attend NA Meeting.  Kennieth Francois 05/20/2023, 9:24 PM

## 2023-05-20 NOTE — Progress Notes (Addendum)
Pt denied SI/HI/AVH this morning. Pt reported that she woke up from a panic attack last night and had to take hydroxyzine to calm down. Pt presents with a depressed and flat affect. Pt has been cooperative throughout the shift. Pt given scheduled medications as prescribed. Q15 min checks verified for safety. Patient verbally contracts for safety. Patient compliant with medications and treatment plan. Patient is interacting well on the unit. Pt is safe on the unit.   05/20/23 0818  Psych Admission Type (Psych Patients Only)  Admission Status Voluntary  Psychosocial Assessment  Patient Complaints Anxiety;Depression;Sleep disturbance ("I wok up from having a panic attack")  Eye Contact Fair  Facial Expression Flat  Affect Appropriate to circumstance  Speech Logical/coherent  Interaction Assertive  Motor Activity Slow  Appearance/Hygiene Unremarkable  Behavior Characteristics Cooperative  Mood Depressed;Sad  Thought Process  Coherency WDL  Content Blaming others  Delusions None reported or observed  Perception WDL  Hallucination None reported or observed  Judgment Impaired  Confusion None  Danger to Self  Current suicidal ideation? Denies  Self-Injurious Behavior No self-injurious ideation or behavior indicators observed or expressed   Agreement Not to Harm Self Yes  Description of Agreement Pt verbally contracts for safety  Danger to Others  Danger to Others None reported or observed

## 2023-05-20 NOTE — Progress Notes (Signed)
[facility] MD Progress Note  05/20/2023 1:18 PM Wendy Pitts  MRN:  [id number] Subjective:   28 year old African-American female, single, recently unemployed, recently homeless.  Background history of trauma, PTSD, cluster B traits and bipolar disorder.  Presented to the hospital via emergency services.  Overdosed on 12,000 mg of ibuprofen.  Called [employer] crisis herself.  Reports being overwhelmed by multiple psychosocial stressors. Routine labs are essentially within normal limits.  Toxicology does not show elevated acetaminophen level.  Normal liver function tests.  UDS positive for THC.  No detectable alcohol.  Chart reviewed today.  Patient discussed at multidisciplinary team meeting.  Nursing staff reports that patient slept for 7 hours.  She woke up early hours of the morning with a panic attack.  She was given as needed hydroxyzine.  No other PRNs required.  Vitals have been stable.  No observed response to internal stimuli.  Social worker will obtain collateral from family member.  Seen today.  Patient states that she had a nightmare of herself choking on CPAP machine.  States that initially she was dreaming of taking care of her father who used to be on the CPAP machine.  Patient states that in the dream, she appeared to be the one on the CPAP machine and she was choking.  Patient acknowledges traumatic experience taking care of her father.  States that she has had this nightmare in the past.  Patient tolerated recent introduction of lurasidone without any adverse effects.  States that she plans to speak to her mother, she has agreed to collateral from her mother.  Patient is not endorsing any current suicidal thoughts.  She is not endorsing any grandiose ideas.  No nihilistic ideas.  She is not experiencing any form of hallucination.  She is future oriented as she wants to get better and get back on her feet. Encouraged to participate with unit groups as and ventilate her feelings to  staff.  Principal Problem: Bipolar 1 disorder, depressed (HCC) Diagnosis: Principal Problem:   Bipolar 1 disorder, depressed (HCC)  Total Time spent with patient: 20 minutes  Past Psychiatric History:  See H&P  Past Medical History:  Past Medical History:  Diagnosis Date   Anxiety    Bipolar 1 disorder (HCC)    History reviewed. No pertinent surgical history. Family History:  Family History  Problem Relation Age of Onset   Bipolar disorder Mother    Family Psychiatric  History:  See H&P  Social History:  Social History   Substance and Sexual Activity  Alcohol Use No     Social History   Substance and Sexual Activity  Drug Use No    Social History   Socioeconomic History   Marital status: Single    Spouse name: Not on file   Number of children: Not on file   Years of education: Not on file   Highest education level: Not on file  Occupational History   Not on file  Tobacco Use   Smoking status: Never   Smokeless tobacco: Never  Vaping Use   Vaping status: Never Used  Substance and Sexual Activity   Alcohol use: No   Drug use: No   Sexual activity: Yes    Birth control/protection: None  Other Topics Concern   Not on file  Social History Narrative   Not on file   Social Drivers of Health   Financial Resource Strain: Not on file  Food Insecurity: Food Insecurity Present (05/19/2023)   Hunger Vital Sign  Worried About Programme researcher, broadcasting/film/video in the Last Year: Sometimes true    Ran Out of Food in the Last Year: Sometimes true  Transportation Needs: Unmet Transportation Needs (05/19/2023)   PRAPARE - Administrator, Civil Service (Medical): Yes    Lack of Transportation (Non-Medical): Yes  Physical Activity: Not on file  Stress: Not on file  Social Connections: Not on file    Current Medications: Current Facility-Administered Medications  Medication Dose Route Frequency Provider Last Rate Last Admin   acetaminophen (TYLENOL) tablet 650 mg   650 mg Oral Q6H PRN Dahlia Byes C, NP   650 mg at 05/19/23 2051   alum & mag hydroxide-simeth (MAALOX/MYLANTA) 200-200-20 MG/5ML suspension 30 mL  30 mL Oral Q4H PRN Onuoha, Josephine C, NP       haloperidol (HALDOL) tablet 5 mg  5 mg Oral TID PRN Dahlia Byes C, NP       And   diphenhydrAMINE (BENADRYL) capsule 50 mg  50 mg Oral TID PRN Dahlia Byes C, NP       haloperidol lactate (HALDOL) injection 5 mg  5 mg Intramuscular TID PRN Dahlia Byes C, NP       And   diphenhydrAMINE (BENADRYL) injection 50 mg  50 mg Intramuscular TID PRN Dahlia Byes C, NP       And   LORazepam (ATIVAN) injection 2 mg  2 mg Intramuscular TID PRN Dahlia Byes C, NP       haloperidol lactate (HALDOL) injection 10 mg  10 mg Intramuscular TID PRN Dahlia Byes C, NP       And   diphenhydrAMINE (BENADRYL) injection 50 mg  50 mg Intramuscular TID PRN Dahlia Byes C, NP       And   LORazepam (ATIVAN) injection 2 mg  2 mg Intramuscular TID PRN Earney Navy, NP       FLUoxetine (PROZAC) capsule 20 mg  20 mg Oral Daily Dahlia Byes C, NP   20 mg at 05/20/23 0729   hydrOXYzine (ATARAX) tablet 25 mg  25 mg Oral TID PRN Onuoha, Chinwendu V, NP   25 mg at 05/20/23 0541   lurasidone (LATUDA) tablet 40 mg  40 mg Oral QHS Mykaela Arena, Delight Ovens, MD   40 mg at 05/19/23 2105   magnesium hydroxide (MILK OF MAGNESIA) suspension 30 mL  30 mL Oral Daily PRN Dahlia Byes C, NP   30 mL at 05/19/23 2051    Lab Results:  Results for orders placed or performed during the hospital encounter of 05/18/23 (from the past 48 hours)  Comprehensive metabolic panel     Status: None   Collection Time: 05/18/23  9:40 PM  Result Value Ref Range   Sodium 137 135 - 145 mmol/L   Potassium 4.1 3.5 - 5.1 mmol/L   Chloride 106 98 - 111 mmol/L   CO2 23 22 - 32 mmol/L   Glucose, Bld 90 70 - 99 mg/dL    Comment: Glucose reference range applies only to samples taken after fasting for at least 8 hours.    BUN 11 6 - 20 mg/dL   Creatinine, Ser 4.09 0.44 - 1.00 mg/dL   Calcium 8.9 8.9 - 81.1 mg/dL   Total Protein 7.4 6.5 - 8.1 g/dL   Albumin 3.6 3.5 - 5.0 g/dL   AST 20 15 - 41 U/L   ALT 22 0 - 44 U/L   Alkaline Phosphatase 76 38 - 126 U/L   Total Bilirubin 0.6  0.0 - 1.2 mg/dL   GFR, Estimated >10 >93 mL/min    Comment: (NOTE) Calculated using the CKD-EPI Creatinine Equation (2021)    Anion gap 8 5 - 15    Comment: Performed at [facility], [address] [place], [place] [address]  Salicylate level     Status: Abnormal   Collection Time: 05/18/23  9:40 PM  Result Value Ref Range   Salicylate Lvl <7.0 (L) 7.0 - 30.0 mg/dL    Comment: Performed at [facility], [address], [place], [place] [address]  Acetaminophen level     Status: Abnormal   Collection Time: 05/18/23  9:40 PM  Result Value Ref Range   Acetaminophen (Tylenol), Serum <10 (L) 10 - 30 ug/mL    Comment: (NOTE) Therapeutic concentrations vary significantly. A range of 10-30 ug/mL  may be an effective concentration for many patients. However, some  are best treated at concentrations outside of this range. Acetaminophen concentrations >150 ug/mL at 4 hours after ingestion  and >50 ug/mL at 12 hours after ingestion are often associated with  toxic reactions.  Performed at [facility], [address], [place], [place] [address]   Ethanol     Status: None   Collection Time: 05/18/23  9:40 PM  Result Value Ref Range   Alcohol, Ethyl (B) <10 <10 mg/dL    Comment: (NOTE) Lowest detectable limit for serum alcohol is 10 mg/dL.  For medical purposes only. Performed at [facility], [address], [place], [place] [address]   CBC WITH DIFFERENTIAL     Status: Abnormal   Collection Time: 05/18/23  9:40 PM  Result Value Ref Range   WBC 8.1 4.0 - 10.5 K/uL   RBC 3.80 (L) 3.87 - 5.11 MIL/uL   Hemoglobin 11.6 (L) 12.0 - 15.0 g/dL   HCT  28.3 15.1 - 76.1 %   MCV 96.6 80.0 - 100.0 fL   MCH 30.5 26.0 - 34.0 pg   MCHC 31.6 30.0 - 36.0 g/dL   RDW 60.7 37.1 - 06.2 %   Platelets 310 150 - 400 K/uL   nRBC 0.0 0.0 - 0.2 %   Neutrophils Relative % 64 %   Neutro Abs 5.2 1.7 - 7.7 K/uL   Lymphocytes Relative 25 %   Lymphs Abs 2.0 0.7 - 4.0 K/uL   Monocytes Relative 9 %   Monocytes Absolute 0.7 0.1 - 1.0 K/uL   Eosinophils Relative 2 %   Eosinophils Absolute 0.2 0.0 - 0.5 K/uL   Basophils Relative 0 %   Basophils Absolute 0.0 0.0 - 0.1 K/uL   Immature Granulocytes 0 %   Abs Immature Granulocytes 0.03 0.00 - 0.07 K/uL    Comment: Performed at [facility], [address] [place], [place] [address]  hCG, serum, qualitative     Status: None   Collection Time: 05/18/23  9:40 PM  Result Value Ref Range   Preg, Serum NEGATIVE NEGATIVE    Comment:        THE SENSITIVITY OF THIS METHODOLOGY IS >10 mIU/mL. Performed at [facility], [address], [place], [place] [address]   Basic metabolic panel     Status: None   Collection Time: 05/19/23  2:00 AM  Result Value Ref Range   Sodium 136 135 - 145 mmol/L   Potassium 3.8 3.5 - 5.1 mmol/L   Chloride 106 98 - 111 mmol/L   CO2 22 22 - 32 mmol/L  Glucose, Bld 94 70 - 99 mg/dL    Comment: Glucose reference range applies only to samples taken after fasting for at least 8 hours.   BUN 12 6 - 20 mg/dL   Creatinine, Ser 1.61 0.44 - 1.00 mg/dL   Calcium 8.9 8.9 - 09.6 mg/dL   GFR, Estimated >04 >54 mL/min    Comment: (NOTE) Calculated using the CKD-EPI Creatinine Equation (2021)    Anion gap 8 5 - 15    Comment: Performed at [facility], [address] [place], [place] [address]  Acetaminophen level     Status: Abnormal   Collection Time: 05/19/23  2:00 AM  Result Value Ref Range   Acetaminophen (Tylenol), Serum <10 (L) 10 - 30 ug/mL    Comment: (NOTE) Therapeutic concentrations vary significantly. A range of 10-30  ug/mL  may be an effective concentration for many patients. However, some  are best treated at concentrations outside of this range. Acetaminophen concentrations >150 ug/mL at 4 hours after ingestion  and >50 ug/mL at 12 hours after ingestion are often associated with  toxic reactions.  Performed at [facility], [address], [place], [place] [address]   Salicylate level     Status: Abnormal   Collection Time: 05/19/23  2:00 AM  Result Value Ref Range   Salicylate Lvl <7.0 (L) 7.0 - 30.0 mg/dL    Comment: Performed at [facility], [address] [place], [place] [address]  CBG monitoring, ED     Status: Abnormal   Collection Time: 05/19/23  8:15 AM  Result Value Ref Range   Glucose-Capillary 101 (H) 70 - 99 mg/dL    Comment: Glucose reference range applies only to samples taken after fasting for at least 8 hours.    Blood Alcohol level:  Lab Results  Component Value Date   ETH <10 05/18/2023    Metabolic Disorder Labs: No results found for: "HGBA1C", "MPG" No results found for: "PROLACTIN" No results found for: "CHOL", "TRIG", "HDL", "CHOLHDL", "VLDL", "LDLCALC"  Physical Findings: AIMS:  , ,  ,  ,    CIWA:    COWS:     Musculoskeletal: Strength & Muscle Tone: within normal limits Gait & Station: normal Patient leans: N/A  Psychiatric Specialty Exam:  Presentation  General Appearance:  Overweight, in hospital clothing, not in any distress.  No EPS.  Eye Contact: Good.   Speech: Spontaneous, slightly pressured but not loud.   Mood and Affect  Mood: Depressed.   Affect: Restricted and appropriate.  Thought Process  Thought Processes: Linear   Descriptions of Associations:Intact   Orientation:Full (Time, Place and Person)   Thought Content: Negative ruminations about her psychosocial situation.  No guilty rumination.  No current suicidal thoughts.  No homicidal thoughts.  No thoughts of violence.  No  obsessions.  No delusional theme.   Hallucinations: No hallucination in any modality.   Sensorium  Memory: Immediate Good; Recent Good   Judgment: Good.   Insight: Good.   Executive Functions  Concentration: Good.   Attention Span: Good   Recall: Good   Fund of Knowledge: Good   Language: Good     Psychomotor Activity  Normal psychomotor activity.   Physical Exam: Physical Exam ROS Blood pressure (!) 105/57, pulse 81, temperature 98.8 F (37.1 C), temperature source Oral, resp. rate 18, height 5\' 4"  (1.626 m), weight (!) 144.2 kg, SpO2 100%. Body mass index is 54.58 kg/m.   Treatment Plan Summary: Patient has  tolerated recent introduction of lurasidone well.  She had an anxiety attack which was triggered by a nightmare.  No panic attack during the day.  No overwhelming anxiety during the day.  We discussed adding prazosin to target nightmares.  Patient consented to treatment after reviewing the pros and cons.  We will keep her other medicines the same, will gather collateral from her family and evaluate her further.  1.  Lurasidone 40 mg with snacks at bedtime. 2.  Continue fluoxetine 20 mg daily. 3.  Add prazosin 2 mg at bedtime. 4.  Encourage unit groups and therapeutic activities. 5.  Monitor mood behavior and interaction with others. 6.  Social worker will obtain collateral from her family. 7.  Social worker will facilitate discharge and aftercare planning.   Georgiann Cocker, MD 05/20/2023, 1:18 PM

## 2023-05-20 NOTE — [facility] Group Notes (Signed)
Adult Psychoeducational Group Note  Date:  05/20/2023 Time:  8:42 PM  Group Topic/Focus:  Wrap-Up Group:   The focus of this group is to help patients review their daily goal of treatment and discuss progress on daily workbooks.  Participation Level:  Active  Participation Quality:  Attentive  Affect:  Appropriate  Cognitive:  Alert  Insight: Appropriate  Engagement in Group:  Engaged  Modes of Intervention:  Discussion  Additional Comments:  Patient attended and participated in the Wrap-up group.  Jearl Klinefelter 05/20/2023, 8:42 PM

## 2023-05-21 DIAGNOSIS — F319 Bipolar disorder, unspecified: Secondary | ICD-10-CM | POA: Diagnosis not present

## 2023-05-21 MED ORDER — ENSURE ENLIVE PO LIQD
237.0000 mL | Freq: Two times a day (BID) | ORAL | Status: DC
  Filled 2023-05-21 (×11): qty 237

## 2023-05-21 MED ORDER — LURASIDONE HCL 60 MG PO TABS
60.0000 mg | ORAL_TABLET | Freq: Every day | ORAL | Status: DC
  Administered 2023-05-21 – 2023-05-24 (×4): 60 mg via ORAL
  Filled 2023-05-21 (×6): qty 1
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
## 2023-05-21 NOTE — Progress Notes (Signed)
   05/21/23 0556  15 Minute Checks  Location Bedroom  Visual Appearance Calm  Behavior Sleeping  Sleep (Behavioral Health Patients Only)  Calculate sleep? (Click Yes once per 24 hr at 0600 safety check) Yes  Documented sleep last 24 hours 8.5

## 2023-05-21 NOTE — Progress Notes (Signed)
Pt reported experiencing a lot of anxiety throughout the day. Pt stated that her anxiety was more manageable when she previously received scheduled Hydroxyzine. Encouraged pt to speak to the provider about this in the AM.   05/20/23 2000  Psych Admission Type (Psych Patients Only)  Admission Status Voluntary  Psychosocial Assessment  Patient Complaints Anxiety;Depression;Sadness  Eye Contact Fair  Facial Expression Anxious;Sad  Affect Anxious  Speech Logical/coherent  Interaction Assertive  Motor Activity Slow  Appearance/Hygiene Unremarkable  Behavior Characteristics Cooperative  Mood Depressed;Anxious  Thought Process  Coherency WDL  Content Blaming others  Delusions None reported or observed  Perception WDL  Hallucination None reported or observed  Judgment Impaired  Confusion None  Danger to Self  Current suicidal ideation? Passive  Description of Suicide Plan No plan  Agreement Not to Harm Self Yes  Description of Agreement verbal  Danger to Others  Danger to Others None reported or observed

## 2023-05-21 NOTE — Progress Notes (Signed)
[facility] MD Progress Note  05/21/2023 2:14 PM Wendy Pitts  MRN:  [id number] Subjective:   28 year old African-American female, single, recently unemployed, recently homeless.  Background history of trauma, PTSD, cluster B traits and bipolar disorder.  Presented to the hospital via emergency services.  Overdosed on 12,000 mg of ibuprofen.  Called [facility] herself.  Reports being overwhelmed by multiple psychosocial stressors. Routine labs are essentially within normal limits.  Toxicology does not show elevated acetaminophen level.  Normal liver function tests.  UDS positive for THC.  No detectable alcohol.  Chart reviewed today.  Patient discussed at multidisciplinary team meeting.  Staff reports that patient slept for 8.5 hours.  She has been adherent with her medication.  No panic attack on the unit.  She is participating with groups and therapeutic activities.  She has been taking hydroxyzine on an as needed basis.  She has not required any other psychotropic medication on an as needed basis.  Social worker has not been able to obtain collateral from family.  Seen today.  Patient states that she slept better with prazosin.  She did not experience any nightmares last night.  She states that she woke up and became anxious because she feels slightly groggy.  This resolved almost immediately.  Patient states that she has been in communication with her mother.  Her mother is currently in concerns about him her weight to [place].  States that her mother is going to help out 5 for unemployment and hopefully get some accommodation where they could both lived together here.  Patient continues to feel fluctuations in her mood.  She is not endorsing any expansive ideas.  She is not endorsing any hallucinations.  No current suicidal thoughts.  No rageful thoughts towards others or to property.  Encouraged to keep participating with unit groups and therapeutic activities.   Principal Problem: Bipolar  1 disorder, depressed (HCC) Diagnosis: Principal Problem:   Bipolar 1 disorder, depressed (HCC)  Total Time spent with patient: 20 minutes  Past Psychiatric History:  See H&P  Past Medical History:  Past Medical History:  Diagnosis Date   Anxiety    Bipolar 1 disorder (HCC)    History reviewed. No pertinent surgical history. Family History:  Family History  Problem Relation Age of Onset   Bipolar disorder Mother    Family Psychiatric  History:  See H&P  Social History:  Social History   Substance and Sexual Activity  Alcohol Use No     Social History   Substance and Sexual Activity  Drug Use No    Social History   Socioeconomic History   Marital status: Single    Spouse name: Not on file   Number of children: Not on file   Years of education: Not on file   Highest education level: Not on file  Occupational History   Not on file  Tobacco Use   Smoking status: Never   Smokeless tobacco: Never  Vaping Use   Vaping status: Never Used  Substance and Sexual Activity   Alcohol use: No   Drug use: No   Sexual activity: Yes    Birth control/protection: None  Other Topics Concern   Not on file  Social History Narrative   Not on file   Social Drivers of Health   Financial Resource Strain: Not on file  Food Insecurity: Food Insecurity Present (05/19/2023)   Hunger Vital Sign    Worried About Running Out of Food in the Last Year: Sometimes true  Ran Out of Food in the Last Year: Sometimes true  Transportation Needs: Unmet Transportation Needs (05/19/2023)   PRAPARE - Administrator, Civil Service (Medical): Yes    Lack of Transportation (Non-Medical): Yes  Physical Activity: Not on file  Stress: Not on file  Social Connections: Not on file    Current Medications: Current Facility-Administered Medications  Medication Dose Route Frequency Provider Last Rate Last Admin   acetaminophen (TYLENOL) tablet 650 mg  650 mg Oral Q6H PRN Dahlia Byes C, NP   650 mg at 05/20/23 2006   alum & mag hydroxide-simeth (MAALOX/MYLANTA) 200-200-20 MG/5ML suspension 30 mL  30 mL Oral Q4H PRN Onuoha, Josephine C, NP       haloperidol (HALDOL) tablet 5 mg  5 mg Oral TID PRN Dahlia Byes C, NP       And   diphenhydrAMINE (BENADRYL) capsule 50 mg  50 mg Oral TID PRN Dahlia Byes C, NP       haloperidol lactate (HALDOL) injection 5 mg  5 mg Intramuscular TID PRN Dahlia Byes C, NP       And   diphenhydrAMINE (BENADRYL) injection 50 mg  50 mg Intramuscular TID PRN Dahlia Byes C, NP       And   LORazepam (ATIVAN) injection 2 mg  2 mg Intramuscular TID PRN Dahlia Byes C, NP       haloperidol lactate (HALDOL) injection 10 mg  10 mg Intramuscular TID PRN Dahlia Byes C, NP       And   diphenhydrAMINE (BENADRYL) injection 50 mg  50 mg Intramuscular TID PRN Dahlia Byes C, NP       And   LORazepam (ATIVAN) injection 2 mg  2 mg Intramuscular TID PRN Dahlia Byes C, NP       FLUoxetine (PROZAC) capsule 20 mg  20 mg Oral Daily Onuoha, Josephine C, NP   20 mg at 05/21/23 0830   hydrOXYzine (ATARAX) tablet 25 mg  25 mg Oral TID PRN Onuoha, Chinwendu V, NP   25 mg at 05/21/23 0830   lurasidone (LATUDA) tablet 40 mg  40 mg Oral QHS Lesley Galentine A, MD   40 mg at 05/20/23 2155   magnesium hydroxide (MILK OF MAGNESIA) suspension 30 mL  30 mL Oral Daily PRN Dahlia Byes C, NP   30 mL at 05/19/23 2051   prazosin (MINIPRESS) capsule 2 mg  2 mg Oral QHS Corneisha Alvi, Delight Ovens, MD   2 mg at 05/20/23 2155    Lab Results:  No results found for this or any previous visit (from the past 48 hours).   Blood Alcohol level:  Lab Results  Component Value Date   ETH <10 05/18/2023    Metabolic Disorder Labs: No results found for: "HGBA1C", "MPG" No results found for: "PROLACTIN" No results found for: "CHOL", "TRIG", "HDL", "CHOLHDL", "VLDL", "LDLCALC"  Physical Findings: AIMS:  , ,  ,  ,    CIWA:    COWS:      Musculoskeletal: Strength & Muscle Tone: within normal limits Gait & Station: normal Patient leans: N/A  Psychiatric Specialty Exam:  Presentation  General Appearance:  Overweight, casually dressed, not in any distress.  No EPS.  Eye Contact: Good.   Speech: Spontaneous, less pressured.   Mood and Affect  Mood: Depressed.   Affect: Restricted and appropriate.  Thought Process  Thought Processes: Linear   Descriptions of Associations:Intact   Orientation:Full (Time, Place and Person)   Thought Content: Negative  ruminations about her psychosocial situation.  No guilty rumination.  No current suicidal thoughts.  No homicidal thoughts.  No thoughts of violence.  No obsessions.  No delusional theme.   Hallucinations: No hallucination in any modality.   Sensorium  Memory: Immediate Good; Recent Good   Judgment: Good.   Insight: Good.   Executive Functions  Concentration: Good.   Attention Span: Good   Recall: Good   Fund of Knowledge: Good   Language: Good     Psychomotor Activity  Normal psychomotor activity.   Physical Exam: Physical Exam ROS Blood pressure 118/64, pulse 92, temperature 98.3 F (36.8 C), temperature source Oral, resp. rate 20, height 5\' 4"  (1.626 m), weight (!) 144.2 kg, SpO2 99%. Body mass index is 54.58 kg/m.   Treatment Plan Summary: Patient slept better with prazosin and tolerated it well.  No nightmares last night.  She still has some ups and downs in her mood.  No associated psychotic features.  No associated rageful thoughts.  She has been in communication with her mother who she hopes will support her. We will optimize lurasidone to 60 mg at bedtime.  Patient will continue to use hydroxyzine on an as needed basis.  We will keep other regimen the same and evaluate her further.  1.  Lurasidone 60 mg with snacks at bedtime. 2.  Continue fluoxetine 20 mg daily. 3.  Prazosin 2 mg at bedtime. 4.  Use hydroxyzine on  an as needed basis. 5.  Monitor mood behavior and interaction with others. 6.  Continue to encourage unit groups and therapeutic activities. 7.  Social worker will obtain collateral from her mother. 8.  Social worker will facilitate discharge and aftercare planning.   Georgiann Cocker, MD 05/21/2023, 2:14 PM

## 2023-05-21 NOTE — Plan of Care (Signed)
  Problem: Coping: Goal: Will verbalize feelings Outcome: Progressing   Problem: Safety: Goal: Ability to disclose and discuss suicidal ideas will improve Outcome: Progressing   Problem: Self-Concept: Goal: Level of anxiety will decrease Outcome: Not Progressing

## 2023-05-21 NOTE — Plan of Care (Signed)
Problem: Education: Goal: Emotional status will improve Outcome: Progressing Goal: Mental status will improve Outcome: Progressing

## 2023-05-21 NOTE — Group Note (Signed)
LCSW Group Therapy Note  Group Date: 05/21/2023 Start Time: 1100 End Time: 1200   Participation:  patient was present   Type of Therapy:  Group Therapy  Title:  "Shining from Within: Confidence and Self-Love Journey"  Objective:  The focus of today's session is to explore how confidence and self-love can be nurtured over time through self-compassion, recognizing strengths, and taking small, intentional steps.  Goals: Foster self-love and acceptance by embracing strengths and imperfections. Develop confidence through actionable steps and mindset shifts. Practice patience during personal growth, acknowledging setbacks as part of the journey.  Summary:  This session focused on self-love as the foundation for confidence.  Participants practiced helpful self-talk, identified strengths, set small goals, and reflected on achievements and social support.  It emphasized that building confidence is a continuous process requiring patience and self-care.  Therapeutic Modalities: Cognitive Behavioral Therapy (CBT): Used to challenge and replace unhelpful self-talk with more supportive thoughts, enhancing self-esteem. Mindfulness and Self-Compassion Practices: Encourages reflection on strengths, gratitude, and the creation of a positive environment to foster a sense of well-being.   Alla Feeling, LCSWA 05/21/2023  6:49 PM

## 2023-05-21 NOTE — Progress Notes (Signed)
   05/21/23 1000  Psych Admission Type (Psych Patients Only)  Admission Status Voluntary  Psychosocial Assessment  Patient Complaints Anxiety;Decreased concentration  Eye Contact Fair  Facial Expression Anxious  Affect Appropriate to circumstance  Speech Logical/coherent  Interaction Assertive  Motor Activity Slow  Appearance/Hygiene Unremarkable  Behavior Characteristics Cooperative;Appropriate to situation  Mood Anxious  Thought Process  Coherency WDL  Content WDL  Delusions None reported or observed  Perception WDL  Hallucination None reported or observed  Judgment Impaired  Confusion None  Danger to Self  Current suicidal ideation? Denies  Self-Injurious Behavior No self-injurious ideation or behavior indicators observed or expressed   Danger to Others  Danger to Others None reported or observed

## 2023-05-21 NOTE — Group Note (Signed)
Date:  05/21/2023 Time:  11:33 AM  Group Topic/Focus:  Orientation:   The focus of this group is to educate the patient on the purpose and policies of crisis stabilization and provide a format to answer questions about their admission.  The group details unit policies and expectations of patients while admitted.    Participation Level:  Minimal  Participation Quality:  Appropriate  Affect:  Appropriate  Cognitive:  Appropriate  Insight: Appropriate  Engagement in Group:  Limited  Modes of Intervention:  Discussion and Orientation  Additional Comments:    Jaleen Finch D Zahari Fazzino 05/21/2023, 11:33 AM

## 2023-05-22 DIAGNOSIS — F319 Bipolar disorder, unspecified: Secondary | ICD-10-CM | POA: Diagnosis not present

## 2023-05-22 NOTE — Progress Notes (Signed)
   05/22/23 2113  Psych Admission Type (Psych Patients Only)  Admission Status Voluntary  Psychosocial Assessment  Patient Complaints Anxiety;Depression;Worrying  Eye Contact Fair  Facial Expression Anxious  Affect Appropriate to circumstance  Speech Logical/coherent  Interaction Assertive  Motor Activity Slow  Appearance/Hygiene Unremarkable  Behavior Characteristics Cooperative  Mood Anxious  Thought Process  Coherency WDL  Content WDL  Delusions None reported or observed  Perception WDL  Hallucination None reported or observed  Judgment Impaired  Confusion None  Danger to Self  Current suicidal ideation? Denies  Agreement Not to Harm Self Yes  Description of Agreement verbal  Danger to Others  Danger to Others None reported or observed

## 2023-05-22 NOTE — [facility] Group Notes (Signed)
[facility] Group Notes:  (Nursing/MHT/Case Management/Adjunct)  Date:  05/22/2023  Time:  9:45 PM  Type of Therapy:  Psychoeducational Skills  Participation Level:  None  Participation Quality:  Attentive  Affect:  Appropriate  Cognitive:  Lacking  Insight:  Lacking  Engagement in Group:  None  Modes of Intervention:  Education  Summary of Progress/Problems:  The patient attended the evening group but did not participate.   Hazle Coca S 05/22/2023, 9:45 PM

## 2023-05-22 NOTE — Group Note (Signed)
Date:  05/22/2023 Time:  4:11 AM  Group Topic/Focus:  Wrap-Up Group:   The focus of this group is to help patients review their daily goal of treatment and discuss progress on daily workbooks.    Participation Level:  Active  Participation Quality:  Appropriate and Sharing  Affect:  Appropriate  Cognitive:  Appropriate  Insight: Appropriate  Engagement in Group:  Engaged  Modes of Intervention:  Activity and Socialization  Additional Comments:  Patient shared that she is doing "alright" and her day was "alright". Patient rated her day a 5/10. Patient mentioned a conversation with her mother in reference to the rating. Patient shared her goal for today was "to stay engaged with others". Patient participated in ice breaker activity after sharing.   Kennieth Francois 05/22/2023, 4:11 AM

## 2023-05-22 NOTE — Progress Notes (Signed)
[facility] MD Progress Note  05/22/2023 1:11 PM Wendy Pitts  MRN:  [id number] Subjective:   28 year old African-American female, single, recently unemployed, recently homeless.  Background history of trauma, PTSD, cluster B traits and bipolar disorder.  Presented to the hospital via emergency services.  Overdosed on 12,000 mg of ibuprofen.  Called [employer] crisis herself.  Reports being overwhelmed by multiple psychosocial stressors. Routine labs are essentially within normal limits.  Toxicology does not show elevated acetaminophen level.  Normal liver function tests.  UDS positive for THC.  No detectable alcohol.  Chart reviewed today.  Patient discussed at multidisciplinary team meeting.  Nursing staff reports that patient has been adherent with her regimen.  She was disappointed with her mother yesterday as her mother was drunk calling the unit repeatedly.  Patient slept for 8 hours.  No challenging behavior.  No observed response to internal stimuli.  Seen today.  Patient tolerated recent adjustments made to her medication.  She tells me that she is feeling better.  States that she has noticed that her thoughts are not as crowded as they used to be.  She is able to think more clearly.  States that she is beginning to process ways of getting back to her feet.  She might have to spend some time with her sister in [place] until she is able to afford a place of her own.  States that she has applied for housing.  Patient is not endorsing any expansive ideas.  No nihilistic ideas.  No self-injurious thoughts.  No rageful thoughts towards others or to property.  She is not endorsing any abnormal perception.  She feels that she is taking a giant step towards being discharged soon.   Principal Problem: Bipolar 1 disorder, depressed (HCC) Diagnosis: Principal Problem:   Bipolar 1 disorder, depressed (HCC)  Total Time spent with patient: 20 minutes  Past Psychiatric History:  See H&P  Past Medical  History:  Past Medical History:  Diagnosis Date   Anxiety    Bipolar 1 disorder (HCC)    History reviewed. No pertinent surgical history. Family History:  Family History  Problem Relation Age of Onset   Bipolar disorder Mother    Family Psychiatric  History:  See H&P  Social History:  Social History   Substance and Sexual Activity  Alcohol Use No     Social History   Substance and Sexual Activity  Drug Use No    Social History   Socioeconomic History   Marital status: Single    Spouse name: Not on file   Number of children: Not on file   Years of education: Not on file   Highest education level: Not on file  Occupational History   Not on file  Tobacco Use   Smoking status: Never   Smokeless tobacco: Never  Vaping Use   Vaping status: Never Used  Substance and Sexual Activity   Alcohol use: No   Drug use: No   Sexual activity: Yes    Birth control/protection: None  Other Topics Concern   Not on file  Social History Narrative   Not on file   Social Drivers of Health   Financial Resource Strain: Not on file  Food Insecurity: Food Insecurity Present (05/19/2023)   Hunger Vital Sign    Worried About Running Out of Food in the Last Year: Sometimes true    Ran Out of Food in the Last Year: Sometimes true  Transportation Needs: Unmet Transportation Needs (05/19/2023)   PRAPARE -  Administrator, Civil Service (Medical): Yes    Lack of Transportation (Non-Medical): Yes  Physical Activity: Not on file  Stress: Not on file  Social Connections: Not on file    Current Medications: Current Facility-Administered Medications  Medication Dose Route Frequency Provider Last Rate Last Admin   acetaminophen (TYLENOL) tablet 650 mg  650 mg Oral Q6H PRN Dahlia Byes C, NP   650 mg at 05/21/23 1728   alum & mag hydroxide-simeth (MAALOX/MYLANTA) 200-200-20 MG/5ML suspension 30 mL  30 mL Oral Q4H PRN Onuoha, Josephine C, NP       haloperidol (HALDOL) tablet 5  mg  5 mg Oral TID PRN Dahlia Byes C, NP       And   diphenhydrAMINE (BENADRYL) capsule 50 mg  50 mg Oral TID PRN Dahlia Byes C, NP       haloperidol lactate (HALDOL) injection 5 mg  5 mg Intramuscular TID PRN Dahlia Byes C, NP       And   diphenhydrAMINE (BENADRYL) injection 50 mg  50 mg Intramuscular TID PRN Dahlia Byes C, NP       And   LORazepam (ATIVAN) injection 2 mg  2 mg Intramuscular TID PRN Dahlia Byes C, NP       haloperidol lactate (HALDOL) injection 10 mg  10 mg Intramuscular TID PRN Dahlia Byes C, NP       And   diphenhydrAMINE (BENADRYL) injection 50 mg  50 mg Intramuscular TID PRN Dahlia Byes C, NP       And   LORazepam (ATIVAN) injection 2 mg  2 mg Intramuscular TID PRN Dahlia Byes C, NP       feeding supplement (ENSURE ENLIVE / ENSURE PLUS) liquid 237 mL  237 mL Oral BID BM Massengill, Nathan, MD       FLUoxetine (PROZAC) capsule 20 mg  20 mg Oral Daily Onuoha, Josephine C, NP   20 mg at 05/22/23 0751   hydrOXYzine (ATARAX) tablet 25 mg  25 mg Oral TID PRN Onuoha, Chinwendu V, NP   25 mg at 05/22/23 0751   Lurasidone HCl TABS 60 mg  60 mg Oral QHS Kaizen Ibsen A, MD   60 mg at 05/21/23 2054   magnesium hydroxide (MILK OF MAGNESIA) suspension 30 mL  30 mL Oral Daily PRN Dahlia Byes C, NP   30 mL at 05/19/23 2051   prazosin (MINIPRESS) capsule 2 mg  2 mg Oral QHS Deaveon Schoen, Delight Ovens, MD   2 mg at 05/21/23 2054    Lab Results:  No results found for this or any previous visit (from the past 48 hours).   Blood Alcohol level:  Lab Results  Component Value Date   ETH <10 05/18/2023    Metabolic Disorder Labs: No results found for: "HGBA1C", "MPG" No results found for: "PROLACTIN" No results found for: "CHOL", "TRIG", "HDL", "CHOLHDL", "VLDL", "LDLCALC"  Physical Findings: AIMS:  , ,  ,  ,    CIWA:    COWS:     Musculoskeletal: Strength & Muscle Tone: within normal limits Gait & Station: normal Patient  leans: N/A  Psychiatric Specialty Exam:  Presentation  General Appearance:  Overweight, casually dressed, good relatedness, appropriate behavior, not in any distress.  No EPS.  Eye Contact: Good.   Speech: Spontaneous, normal rate, tone and volume.   Mood and Affect  Mood: Subjectively and objectively better.  Affect: Mood congruent.  Thought Process  Thought Processes: Linear and goal directed  Descriptions of Associations:Intact   Orientation:Full (Time, Place and Person)   Thought Content: Future-oriented.  Less negative ruminations about her psychosocial situation.  No guilty rumination.  No current suicidal thoughts.  No homicidal thoughts.  No thoughts of violence.  No obsessions.  No delusional theme.   Hallucinations: No hallucination in any modality.   Sensorium  Memory: Immediate Good; Recent Good   Judgment: Good.   Insight: Good.   Executive Functions  Concentration: Good.   Attention Span: Good   Recall: Good   Fund of Knowledge: Good   Language: Good     Psychomotor Activity  Normal psychomotor activity.   Physical Exam: Physical Exam ROS Blood pressure 117/73, pulse 81, temperature 98.5 F (36.9 C), temperature source Oral, resp. rate 18, height 5\' 4"  (1.626 m), weight (!) 144.2 kg, SpO2 100%. Body mass index is 54.58 kg/m.   Treatment Plan Summary: Patient is responding appropriately to medication adjustments made.  Her mood is stabilizing appropriately.  She is not expressing any futility thoughts.  Patient is future oriented, she is processing ways of tapping into her resources.  We adjusted her medication yesterday.  We will give it a few days.  Hopeful discharge this weekend if she maintains stability.  1.  Lurasidone 60 mg with snacks at bedtime. 2.  Continue fluoxetine 20 mg daily. 3.  Prazosin 2 mg at bedtime. 4.  Use hydroxyzine on an as needed basis. 5.  Monitor mood behavior and interaction with others. 6.   Continue to encourage unit groups and therapeutic activities. 7.  Social worker will facilitate discharge and aftercare planning.   Georgiann Cocker, MD 05/22/2023, 1:11 PM

## 2023-05-22 NOTE — [facility] Group Notes (Signed)
Spiritual care group facilitated by Chaplain Dyanne Carrel, [facility]  Group focused on topic of strength. Group members reflected on what thoughts and feelings emerge when they hear this topic. They then engaged in facilitated dialog around how strength is present in their lives. This dialog focused on representing what strength had been to them in their lives (images and patterns given) and what they saw as helpful in their life now (what they needed / wanted).  Activity drew on narrative framework.  Patient Progress: Wendy Pitts attended group and actively engaged and participated in group conversation and activities.

## 2023-05-22 NOTE — Progress Notes (Signed)
   05/21/23 2215  Psych Admission Type (Psych Patients Only)  Admission Status Voluntary  Psychosocial Assessment  Patient Complaints Depression;Anxiety;Crying spells  Eye Contact Fair  Facial Expression Sad  Affect Appropriate to circumstance  Speech Logical/coherent  Interaction Assertive  Motor Activity Slow  Appearance/Hygiene Unremarkable  Behavior Characteristics Cooperative;Appropriate to situation  Mood Anxious;Depressed  Thought Process  Coherency WDL  Content Blaming others  Delusions None reported or observed  Perception WDL  Hallucination None reported or observed  Judgment Impaired  Confusion None  Danger to Self  Current suicidal ideation? Denies  Agreement Not to Harm Self Yes  Description of Agreement verbal  Danger to Others  Danger to Others None reported or observed

## 2023-05-22 NOTE — Plan of Care (Signed)
?  Problem: Activity: ?Goal: Interest or engagement in activities will improve ?Outcome: Progressing ?Goal: Sleeping patterns will improve ?Outcome: Progressing ?  ?Problem: Coping: ?Goal: Ability to verbalize frustrations and anger appropriately will improve ?Outcome: Progressing ?Goal: Ability to demonstrate self-control will improve ?Outcome: Progressing ?  ?Problem: Safety: ?Goal: Periods of time without injury will increase ?Outcome: Progressing ?  ?Problem: Medication: ?Goal: Compliance with prescribed medication regimen will improve ?Outcome: Progressing ?  ?

## 2023-05-22 NOTE — Plan of Care (Signed)
  Problem: Activity: Goal: Interest or engagement in activities will improve Outcome: Progressing   Problem: Safety: Goal: Periods of time without injury will increase Outcome: Progressing

## 2023-05-22 NOTE — Progress Notes (Signed)
Patient rated her depression level 6.5/10 and her anxiety level 7/10 with 10 being the highest and 0 none. Pt identified her goal for today as, "Understand what my plans are when I leave also being okay with leaving along with being more social. Medication and group compliant. Pt observed interacting well with some peers. Appetite good on shift. Safety maintained.  05/22/23 0825  Psych Admission Type (Psych Patients Only)  Admission Status Voluntary  Psychosocial Assessment  Patient Complaints Anxiety;Depression  Eye Contact Fair  Facial Expression Anxious  Affect Appropriate to circumstance  Speech Logical/coherent  Interaction Assertive  Motor Activity Slow  Appearance/Hygiene Unremarkable  Behavior Characteristics Cooperative;Appropriate to situation  Mood Anxious;Depressed  Thought Process  Coherency WDL  Content Blaming others  Delusions None reported or observed  Perception WDL  Hallucination None reported or observed  Judgment Impaired  Confusion None  Danger to Self  Current suicidal ideation? Denies  Agreement Not to Harm Self Yes  Description of Agreement Verbal  Danger to Others  Danger to Others None reported or observed

## 2023-05-23 DIAGNOSIS — F319 Bipolar disorder, unspecified: Secondary | ICD-10-CM | POA: Diagnosis not present

## 2023-05-23 MED ORDER — WHITE PETROLATUM EX OINT
TOPICAL_OINTMENT | CUTANEOUS | Status: AC
  Administered 2023-05-23: 1
  Filled 2023-05-23: qty 5

## 2023-05-23 MED ORDER — TRAZODONE HCL 50 MG PO TABS
50.0000 mg | ORAL_TABLET | Freq: Once | ORAL | Status: DC
  Filled 2023-05-23 (×2): qty 1

## 2023-05-23 NOTE — Group Note (Addendum)
 Date:  05/23/2023 Time:  1400  Group Topic/Focus:  Wellness Toolbox:   The focus of this group is to discuss various aspects of wellness, balancing those aspects and exploring ways to increase the ability to experience wellness.  Patients will create a wellness toolbox for use upon discharge.    Participation Level:  Active  Participation Quality:  Appropriate and Attentive  Affect:  Appropriate  Cognitive:  Alert and Appropriate  Insight: Appropriate and Good  Engagement in Group:  Engaged  Modes of Intervention:  Activity, Discussion, Exploration, Rapport Building, Socialization, and Support  Additional Comments:    Shela Nevin 05/23/2023, 7:15 PM

## 2023-05-23 NOTE — [facility] Group Notes (Signed)
Pt did not attend goals/orientation group.  

## 2023-05-23 NOTE — Progress Notes (Signed)
 [facility] MD Progress Note  05/23/2023 1:52 PM Wendy Pitts  MRN:  [id number] Subjective:   28 year old African-American female, single, recently unemployed, recently homeless.  Background history of trauma, PTSD, cluster B traits and bipolar disorder.  Presented to the hospital via emergency services.  Overdosed on 12,000 mg of ibuprofen.  Called [employer] crisis herself.  Reports being overwhelmed by multiple psychosocial stressors. Routine labs are essentially within normal limits.  Toxicology does not show elevated acetaminophen level.  Normal liver function tests.  UDS positive for THC.  No detectable alcohol.  Chart reviewed today.  Patient discussed at multidisciplinary team meeting.  Nursing staff reports that patient did not sleep much last night.  She is slightly irritable requesting for a new provider.  Seen today.  Patient tells me that she was frustrated yesterday.  States that yesterday being Valentine's Day, she was supposed to have a good deal with her ex.  States that she was ruminating on the negative things that transpired between them.  Getting a call from her mother was also upsetting as she was still drinking.  Patient states that she is in better spirits today.  Her goal is to get some rest as she is going home tomorrow.  She plans to stay with her sister in [place].  States that she will pick up her grandmother's house and drive there.  She is tolerating her medicines well.  No suicidal thoughts.  No homicidal thoughts.  No thoughts of violence.  No manic features.  No psychotic features.  Principal Problem: Bipolar 1 disorder, depressed (HCC) Diagnosis: Principal Problem:   Bipolar 1 disorder, depressed (HCC)  Total Time spent with patient: 20 minutes  Past Psychiatric History:  See H&P  Past Medical History:  Past Medical History:  Diagnosis Date   Anxiety    Bipolar 1 disorder (HCC)    History reviewed. No pertinent surgical history. Family History:  Family History   Problem Relation Age of Onset   Bipolar disorder Mother    Family Psychiatric  History:  See H&P  Social History:  Social History   Substance and Sexual Activity  Alcohol Use No     Social History   Substance and Sexual Activity  Drug Use No    Social History   Socioeconomic History   Marital status: Single    Spouse name: Not on file   Number of children: Not on file   Years of education: Not on file   Highest education level: Not on file  Occupational History   Not on file  Tobacco Use   Smoking status: Never   Smokeless tobacco: Never  Vaping Use   Vaping status: Never Used  Substance and Sexual Activity   Alcohol use: No   Drug use: No   Sexual activity: Yes    Birth control/protection: None  Other Topics Concern   Not on file  Social History Narrative   Not on file   Social Drivers of Health   Financial Resource Strain: Not on file  Food Insecurity: Food Insecurity Present (05/19/2023)   Hunger Vital Sign    Worried About Running Out of Food in the Last Year: Sometimes true    Ran Out of Food in the Last Year: Sometimes true  Transportation Needs: Unmet Transportation Needs (05/19/2023)   PRAPARE - Administrator, Civil Service (Medical): Yes    Lack of Transportation (Non-Medical): Yes  Physical Activity: Not on file  Stress: Not on file  Social Connections: Not  on file    Current Medications: Current Facility-Administered Medications  Medication Dose Route Frequency Provider Last Rate Last Admin   acetaminophen (TYLENOL) tablet 650 mg  650 mg Oral Q6H PRN Dahlia Byes C, NP   650 mg at 05/21/23 1728   alum & mag hydroxide-simeth (MAALOX/MYLANTA) 200-200-20 MG/5ML suspension 30 mL  30 mL Oral Q4H PRN Onuoha, Josephine C, NP       haloperidol (HALDOL) tablet 5 mg  5 mg Oral TID PRN Dahlia Byes C, NP       And   diphenhydrAMINE (BENADRYL) capsule 50 mg  50 mg Oral TID PRN Dahlia Byes C, NP       haloperidol lactate  (HALDOL) injection 5 mg  5 mg Intramuscular TID PRN Dahlia Byes C, NP       And   diphenhydrAMINE (BENADRYL) injection 50 mg  50 mg Intramuscular TID PRN Dahlia Byes C, NP       And   LORazepam (ATIVAN) injection 2 mg  2 mg Intramuscular TID PRN Dahlia Byes C, NP       haloperidol lactate (HALDOL) injection 10 mg  10 mg Intramuscular TID PRN Dahlia Byes C, NP       And   diphenhydrAMINE (BENADRYL) injection 50 mg  50 mg Intramuscular TID PRN Dahlia Byes C, NP       And   LORazepam (ATIVAN) injection 2 mg  2 mg Intramuscular TID PRN Dahlia Byes C, NP       feeding supplement (ENSURE ENLIVE / ENSURE PLUS) liquid 237 mL  237 mL Oral BID BM Massengill, Nathan, MD       FLUoxetine (PROZAC) capsule 20 mg  20 mg Oral Daily Onuoha, Josephine C, NP   20 mg at 05/23/23 0756   hydrOXYzine (ATARAX) tablet 25 mg  25 mg Oral TID PRN Onuoha, Chinwendu V, NP   25 mg at 05/23/23 1123   Lurasidone HCl TABS 60 mg  60 mg Oral QHS Zorah Backes A, MD   60 mg at 05/22/23 2200   magnesium hydroxide (MILK OF MAGNESIA) suspension 30 mL  30 mL Oral Daily PRN Dahlia Byes C, NP   30 mL at 05/19/23 2051   prazosin (MINIPRESS) capsule 2 mg  2 mg Oral QHS Ayce Pietrzyk A, MD   2 mg at 05/22/23 2200    Lab Results:  No results found for this or any previous visit (from the past 48 hours).   Blood Alcohol level:  Lab Results  Component Value Date   ETH <10 05/18/2023    Metabolic Disorder Labs: No results found for: "HGBA1C", "MPG" No results found for: "PROLACTIN" No results found for: "CHOL", "TRIG", "HDL", "CHOLHDL", "VLDL", "LDLCALC"  Physical Findings: AIMS:  , ,  ,  ,    CIWA:    COWS:     Musculoskeletal: Strength & Muscle Tone: within normal limits Gait & Station: normal Patient leans: N/A  Psychiatric Specialty Exam:  Presentation  General Appearance:  Overweight, in bed prior to interview, casually dressed, appropriate behavior, not in any  distress.  No EPS.  Eye Contact: Good.   Speech: Spontaneous, normal rate, tone and volume.   Mood and Affect  Mood: Feeling better today.  Affect: Restricted and mood congruent.  Thought Process  Thought Processes: Linear and goal directed   Descriptions of Associations:Intact   Orientation:Full (Time, Place and Person)   Thought Content: Future-oriented.  Less negative ruminations.  No guilty rumination.  No current suicidal thoughts.  No homicidal thoughts.  No thoughts of violence.  No obsessions.  No delusional theme.   Hallucinations: No hallucination in any modality.   Sensorium  Memory: Immediate Good; Recent Good   Judgment: Good.   Insight: Good.   Executive Functions  Concentration: Good.   Attention Span: Good   Recall: Good   Fund of Knowledge: Good   Language: Good     Psychomotor Activity  Normal psychomotor activity.   Physical Exam: Physical Exam ROS Blood pressure 108/60, pulse 100, temperature 98.4 F (36.9 C), temperature source Oral, resp. rate 18, height 5\' 4"  (1.626 m), weight (!) 144.2 kg, SpO2 99%. Body mass index is 54.58 kg/m.   Treatment Plan Summary: Patient had a setback yesterday as it made her reevaluate her past relationship.  She did not sleep too well due to negative thoughts.  She feels better today.  She is future oriented.  No pervasive depression.  No manic symptoms.  No psychosis.  She is tolerating her medicines well.  We will keep her medicine the same and evaluate her further.  Hopeful discharge tomorrow if she maintains stability.  1.  Lurasidone 60 mg with snacks at bedtime. 2.  Continue fluoxetine 20 mg daily. 3.  Prazosin 2 mg at bedtime. 4.  Use hydroxyzine on an as needed basis. 5.  Monitor mood behavior and interaction with others. 6.  Continue to encourage unit groups and therapeutic activities. 7.  Social worker will facilitate discharge and aftercare planning.   Georgiann Cocker,  MD 05/23/2023, 1:52 PM

## 2023-05-23 NOTE — Progress Notes (Signed)
   05/23/23 2151  Psych Admission Type (Psych Patients Only)  Admission Status Voluntary  Psychosocial Assessment  Patient Complaints Anxiety  Eye Contact Fair  Facial Expression Animated  Affect Appropriate to circumstance  Speech Logical/coherent  Interaction Assertive  Motor Activity Other (Comment) (WDL)  Appearance/Hygiene Unremarkable  Behavior Characteristics Appropriate to situation  Mood Pleasant  Thought Process  Coherency WDL  Content WDL  Delusions None reported or observed  Perception WDL  Hallucination None reported or observed  Judgment Impaired  Confusion None  Danger to Self  Current suicidal ideation? Denies  Self-Injurious Behavior No self-injurious ideation or behavior indicators observed or expressed   Agreement Not to Harm Self Yes  Description of Agreement verbal  Danger to Others  Danger to Others None reported or observed

## 2023-05-23 NOTE — Group Note (Signed)
 Date:  05/23/2023 Time:  9:56 PM  Group Topic/Focus:  Wrap-Up Group:   The focus of this group is to help patients review their daily goal of treatment and discuss progress on daily workbooks.    Participation Level:  Active  Participation Quality:  Appropriate and Sharing  Affect:  Appropriate  Cognitive:  Appropriate  Insight: Appropriate  Engagement in Group:  Engaged  Modes of Intervention:  Activity and Socialization  Additional Comments:  Patients used wrap up group sheets. Patient rated her day a 8.5/10. Patient shared her goal for today, "discharge plan and coping" and patient did achieve her goal. Patient shared coping skills that she finds most helpful, "deep breathing and talking it out with others". Patient participated it activity after sharing.   Kennieth Francois 05/23/2023, 9:56 PM

## 2023-05-23 NOTE — Progress Notes (Signed)
   05/23/23 0556  15 Minute Checks  Location Bedroom  Visual Appearance Calm  Behavior Sleeping  Sleep (Behavioral Health Patients Only)  Calculate sleep? (Click Yes once per 24 hr at 0600 safety check) Yes  Documented sleep last 24 hours 6.25

## 2023-05-23 NOTE — Group Note (Signed)
 [facility] LCSW Group Therapy Note    Group Date: 05/23/2023 Start Time: 1000 End Time: 1120  Type of Therapy and Topic:  Group Therapy:  Change/ Overcoming Obstacles  Participation Level:  [facility] PARTICIPATION LEVEL: Did Not Attend  Mood:  Description of Group:   In this group patients will be encouraged to explore what they see as obstacles to their own wellness and recovery. They will be guided to discuss their thoughts, feelings, and behaviors related to these obstacles. The group will process together ways to cope with barriers, with attention given to specific choices patients can make. Each patient will be challenged to identify changes they are motivated to make in order to overcome their obstacles. This group will be process-oriented, with patients participating in exploration of their own experiences as well as giving and receiving support and challenge from other group members.  Therapeutic Goals: 1. Patient will identify personal and current obstacles as they relate to admission. 2. Patient will identify barriers that currently interfere with their wellness or overcoming obstacles.  3. Patient will identify feelings, thought process and behaviors related to these barriers. 4. Patient will identify two changes they are willing to make to overcome these obstacles:    Summary of Patient Progress   Pt was invited, but did not attend.    Therapeutic Modalities:   Cognitive Behavioral Therapy Solution Focused Therapy Motivational Interviewing Relapse Prevention Therapy   Steffanie Dunn, LCSW

## 2023-05-23 NOTE — [facility] Suicide Risk Assessment (Signed)
 [facility] INPATIENT:  Family/Significant Other Suicide Prevention Education  Suicide Prevention Education:  Contact Attempts: Larry Sierras (sister) [phone], (name of family member/significant other) has been identified by the patient as the family member/significant other with whom the patient will be residing, and identified as the person(s) who will aid the patient in the event of a mental health crisis.  With written consent from the patient, two attempts were made to provide suicide prevention education, prior to and/or following the patient's discharge.  We were unsuccessful in providing suicide prevention education.  A suicide education pamphlet was given to the patient to share with family/significant other.  Date and time of first attempt:08/20/2023/4:56pm   Wendy Pitts O Tresia Revolorio 05/23/2023, 4:55 PM

## 2023-05-23 NOTE — Plan of Care (Signed)
  Problem: Education: Goal: Mental status will improve Outcome: Progressing   Problem: Activity: Goal: Interest or engagement in activities will improve Outcome: Progressing Goal: Sleeping patterns will improve Outcome: Progressing   Problem: Coping: Goal: Ability to verbalize frustrations and anger appropriately will improve Outcome: Progressing Goal: Ability to demonstrate self-control will improve Outcome: Progressing   Problem: Physical Regulation: Goal: Ability to maintain clinical measurements within normal limits will improve Outcome: Progressing   Problem: Medication: Goal: Compliance with prescribed medication regimen will improve Outcome: Progressing

## 2023-05-23 NOTE — Progress Notes (Signed)
   05/23/23 0832  Psych Admission Type (Psych Patients Only)  Admission Status Voluntary  Psychosocial Assessment  Patient Complaints Anxiety;Depression  Eye Contact Fair  Facial Expression Sad  Affect Appropriate to circumstance  Speech Logical/coherent  Interaction Assertive  Motor Activity Slow  Appearance/Hygiene Unremarkable  Behavior Characteristics Cooperative  Mood Depressed;Anxious  Thought Process  Coherency WDL  Content WDL  Delusions None reported or observed  Perception WDL  Hallucination None reported or observed  Judgment Impaired  Confusion None  Danger to Self  Current suicidal ideation? Denies  Agreement Not to Harm Self Yes  Description of Agreement Verbal  Danger to Others  Danger to Others None reported or observed

## 2023-05-24 DIAGNOSIS — F319 Bipolar disorder, unspecified: Secondary | ICD-10-CM | POA: Diagnosis not present

## 2023-05-24 NOTE — [facility] Group Notes (Signed)
 [facility] Group Notes:  (Nursing/MHT/Case Management/Adjunct)  Date:  05/24/2023  Time:  9:31 AM  Type of Therapy:   Goals  Participation Level:  Active  Participation Quality:  Appropriate  Affect:  Appropriate  Cognitive:  Appropriate  Insight:  Appropriate  Engagement in Group:  Engaged  Modes of Intervention:  Orientation  Summary of Progress/Problems: Goal is to have a back up plan.  Azalee Course 05/24/2023, 9:31 AM

## 2023-05-24 NOTE — [facility] Suicide Risk Assessment (Signed)
 [facility] INPATIENT:  Family/Significant Other Suicide Prevention Education  Suicide Prevention Education:  .Suicide Prevention Education was reviewed thoroughly with patient, including risk factors, warning signs, and what to do.  Mobile Crisis services were described and that telephone number pointed out, with encouragement to patient to put this number in personal cell phone.  Brochure was provided to patient to share with natural supports.  Patient acknowledged the ways in which they are at risk, and how working through each of their issues can gradually start to reduce their risk factors.  Patient was encouraged to think of the information in the context of people in their own lives.  Patient denied having access to firearms  Patient verbalized understanding of information provided.  Patient endorsed a desire to live.   Steffanie Dunn [facility]  05/24/2023

## 2023-05-24 NOTE — [facility] Group Notes (Signed)
 Pt participated in group activity

## 2023-05-24 NOTE — Progress Notes (Signed)
   05/24/23 2136  Psych Admission Type (Psych Patients Only)  Admission Status Voluntary  Psychosocial Assessment  Patient Complaints Anxiety  Eye Contact Fair  Facial Expression Flat  Affect Appropriate to circumstance  Speech Logical/coherent  Interaction Assertive  Motor Activity Other (Comment) (WDL)  Appearance/Hygiene Unremarkable  Behavior Characteristics Appropriate to situation  Mood Pleasant  Thought Process  Coherency WDL  Content WDL  Delusions None reported or observed  Perception WDL  Hallucination None reported or observed  Judgment Impaired  Confusion None  Danger to Self  Current suicidal ideation? Denies  Self-Injurious Behavior No self-injurious ideation or behavior indicators observed or expressed   Agreement Not to Harm Self Yes  Description of Agreement verbal  Danger to Others  Danger to Others None reported or observed

## 2023-05-24 NOTE — Plan of Care (Signed)
   Problem: Education: Goal: Emotional status will improve Outcome: Progressing Goal: Mental status will improve Outcome: Progressing   Problem: Activity: Goal: Interest or engagement in activities will improve Outcome: Progressing Goal: Sleeping patterns will improve Outcome: Progressing

## 2023-05-24 NOTE — Progress Notes (Signed)
 [facility] MD Progress Note  05/24/2023 12:48 PM Wendy Pitts  MRN:  [id number] Subjective:   28 year old African-American female, single, recently unemployed, recently homeless.  Background history of trauma, PTSD, cluster B traits and bipolar disorder.  Presented to the hospital via emergency services.  Overdosed on 12,000 mg of ibuprofen.  Called [employer] crisis herself.  Reports being overwhelmed by multiple psychosocial stressors. Routine labs are essentially within normal limits.  Toxicology does not show elevated acetaminophen level.  Normal liver function tests.  UDS positive for THC.  No detectable alcohol.  Chart reviewed today.  Patient discussed at multidisciplinary team meeting.  Nursing staff reports that patient's sleep was fragmented last night.  She is worried about her disposition.  She is not endorsing any suicidal thoughts.  No observed response to internal stimuli.  She is taking less as needed hydroxyzine.  Seen today.  States that she had a call with her grandmother yesterday.  Informed by her grandmother that grandmother is in [place].  She would not be back until Monday.  Patient states that her car keys at her grandmother's place.  She will be able to assess its until tomorrow.  States that this bothered her last night soon and she was not able to sleep well.  Patient was relieved to find out that we we will keep her overnight with the goal of discharge tomorrow.  She reports anxiety related to change.  I reassured her that this is not unusual when patients are leaving the hospital.  There are no associated panic attacks.  She is not expressing any manic symptoms.  There are no psychotic symptoms.  No suicidal thoughts.  No rageful thoughts towards others or to property.  No adverse effects from her medications. Reassured.  Principal Problem: Bipolar 1 disorder, depressed (HCC) Diagnosis: Principal Problem:   Bipolar 1 disorder, depressed (HCC)  Total Time  spent with patient: 20 minutes  Past Psychiatric History:  See H&P  Past Medical History:  Past Medical History:  Diagnosis Date   Anxiety    Bipolar 1 disorder (HCC)    History reviewed. No pertinent surgical history. Family History:  Family History  Problem Relation Age of Onset   Bipolar disorder Mother    Family Psychiatric  History:  See H&P  Social History:  Social History   Substance and Sexual Activity  Alcohol Use No     Social History   Substance and Sexual Activity  Drug Use No    Social History   Socioeconomic History   Marital status: Single    Spouse name: Not on file   Number of children: Not on file   Years of education: Not on file   Highest education level: Not on file  Occupational History   Not on file  Tobacco Use   Smoking status: Never   Smokeless tobacco: Never  Vaping Use   Vaping status: Never Used  Substance and Sexual Activity   Alcohol use: No   Drug use: No   Sexual activity: Yes    Birth control/protection: None  Other Topics Concern   Not on file  Social History Narrative   Not on file   Social Drivers of Health   Financial Resource Strain: Not on file  Food Insecurity: Food Insecurity Present (05/19/2023)   Hunger Vital Sign    Worried About Running Out of Food in the Last Year: Sometimes true    Ran Out of Food in the Last Year: Sometimes true  Transportation  Needs: Unmet Transportation Needs (05/19/2023)   PRAPARE - Administrator, Civil Service (Medical): Yes    Lack of Transportation (Non-Medical): Yes  Physical Activity: Not on file  Stress: Not on file  Social Connections: Not on file    Current Medications: Current Facility-Administered Medications  Medication Dose Route Frequency Provider Last Rate Last Admin   acetaminophen (TYLENOL) tablet 650 mg  650 mg Oral Q6H PRN Dahlia Byes C, NP   650 mg at 05/23/23 1838   alum & mag hydroxide-simeth (MAALOX/MYLANTA) 200-200-20 MG/5ML suspension  30 mL  30 mL Oral Q4H PRN Onuoha, Josephine C, NP       haloperidol (HALDOL) tablet 5 mg  5 mg Oral TID PRN Dahlia Byes C, NP       And   diphenhydrAMINE (BENADRYL) capsule 50 mg  50 mg Oral TID PRN Dahlia Byes C, NP       haloperidol lactate (HALDOL) injection 5 mg  5 mg Intramuscular TID PRN Dahlia Byes C, NP       And   diphenhydrAMINE (BENADRYL) injection 50 mg  50 mg Intramuscular TID PRN Dahlia Byes C, NP       And   LORazepam (ATIVAN) injection 2 mg  2 mg Intramuscular TID PRN Dahlia Byes C, NP       haloperidol lactate (HALDOL) injection 10 mg  10 mg Intramuscular TID PRN Dahlia Byes C, NP       And   diphenhydrAMINE (BENADRYL) injection 50 mg  50 mg Intramuscular TID PRN Dahlia Byes C, NP       And   LORazepam (ATIVAN) injection 2 mg  2 mg Intramuscular TID PRN Dahlia Byes C, NP       feeding supplement (ENSURE ENLIVE / ENSURE PLUS) liquid 237 mL  237 mL Oral BID BM Massengill, Nathan, MD       FLUoxetine (PROZAC) capsule 20 mg  20 mg Oral Daily Onuoha, Josephine C, NP   20 mg at 05/24/23 1610   hydrOXYzine (ATARAX) tablet 25 mg  25 mg Oral TID PRN Onuoha, Chinwendu V, NP   25 mg at 05/24/23 9604   Lurasidone HCl TABS 60 mg  60 mg Oral QHS Danija Gosa A, MD   60 mg at 05/23/23 2122   magnesium hydroxide (MILK OF MAGNESIA) suspension 30 mL  30 mL Oral Daily PRN Dahlia Byes C, NP   30 mL at 05/23/23 1651   prazosin (MINIPRESS) capsule 2 mg  2 mg Oral QHS Bodin Gorka A, MD   2 mg at 05/23/23 2207   traZODone (DESYREL) tablet 50 mg  50 mg Oral Once Bobbitt, Shalon E, NP        Lab Results:  No results found for this or any previous visit (from the past 48 hours).   Blood Alcohol level:  Lab Results  Component Value Date   ETH <10 05/18/2023    Metabolic Disorder Labs: No results found for: "HGBA1C", "MPG" No results found for: "PROLACTIN" No results found for: "CHOL", "TRIG", "HDL", "CHOLHDL", "VLDL",  "LDLCALC"  Physical Findings: AIMS:  , ,  ,  ,    CIWA:    COWS:     Musculoskeletal: Strength & Muscle Tone: within normal limits Gait & Station: normal Patient leans: N/A  Psychiatric Specialty Exam:  Presentation  General Appearance:  Overweight, casually dressed, good relatedness, not in any distress.  No EPS.  Eye Contact: Good.   Speech: Spontaneous, normal rate, tone and volume.  Normal prosody of speech   Mood and Affect  Mood: Euthymic.  Affect: Restricted and mood congruent.  Thought Process  Thought Processes: Linear and goal directed   Descriptions of Associations:Intact   Orientation:Full (Time, Place and Person)   Thought Content: Future-oriented.  Less negative ruminations.  No guilty rumination.  No current suicidal thoughts.  No homicidal thoughts.  No thoughts of violence.  No obsessions.  No delusional theme.   Hallucinations: No hallucination in any modality.   Sensorium  Memory: Immediate Good; Recent Good   Judgment: Good.   Insight: Good.   Executive Functions  Concentration: Good.   Attention Span: Good   Recall: Good   Fund of Knowledge: Good   Language: Good     Psychomotor Activity  Normal psychomotor activity.   Physical Exam: Physical Exam ROS Blood pressure 102/77, pulse 90, temperature 98.5 F (36.9 C), temperature source Oral, resp. rate 18, height 5\' 4"  (1.626 m), weight (!) 144.2 kg, SpO2 99%. Body mass index is 54.58 kg/m.   Treatment Plan Summary: Patient has a history of bipolar disorder but was not on a mood stabilizer prior to hospitalization.  She has multiple psychosocial stressors but seems resourceful and plans to get back on her feet.  Patient has responded well to medication adjustments during this hospital stay.  Lurasidone and prazosin were added.  She was scheduled for discharge on Sunday but was postponed to Monday due to logistic issues.  There is no dangerousness.  No residual manic  features.  No residual psychotic features.  Some cluster B traits coming to the surface. We will maintain her current regimen and hopefully discharge her on Monday.  1.  Lurasidone 60 mg with snacks at bedtime. 2.  Continue fluoxetine 20 mg daily. 3.  Prazosin 2 mg at bedtime. 4.  Use hydroxyzine on an as needed basis. 5.  Monitor mood behavior and interaction with others. 6.  Continue to encourage unit groups and therapeutic activities. 7.  Social worker will facilitate discharge and aftercare planning.   Georgiann Cocker, MD 05/24/2023, 12:48 PM

## 2023-05-24 NOTE — Progress Notes (Signed)
   05/24/23 0530  15 Minute Checks  Location Bedroom  Visual Appearance Calm  Behavior Sleeping  Sleep (Behavioral Health Patients Only)  Calculate sleep? (Click Yes once per 24 hr at 0600 safety check) Yes  Documented sleep last 24 hours 7.5

## 2023-05-24 NOTE — Plan of Care (Signed)
  Problem: Education: Goal: Knowledge of [facility] Education information/materials will improve Outcome: Progressing   Problem: Education: Goal: Emotional status will improve Outcome: Progressing   

## 2023-05-24 NOTE — B[facility] Suicide Risk Assessment (Signed)
 [facility] INPATIENT:  Family/Significant Other Suicide Prevention Education  Suicide Prevention Education:  Contact Attempts: Wendy Pitts (sister) [phone], (name of family member/significant other) has been identified by the patient as the family member/significant other with whom the patient will be residing, and identified as the person(s) who will aid the patient in the event of a mental health crisis.  With written consent from the patient, two attempts were made to provide suicide prevention education, prior to and/or following the patient's discharge.  We were unsuccessful in providing suicide prevention education.  A suicide education pamphlet was given to the patient to share with family/significant other.  Date and time of first attempt: 2/16 @0805   Tenley Winward LCSWA 05/24/2023, 8:08 AM

## 2023-05-24 NOTE — Progress Notes (Signed)
   05/24/23 0900  Psych Admission Type (Psych Patients Only)  Admission Status Voluntary  Psychosocial Assessment  Patient Complaints Anxiety;Depression (Anxiety and Depression 7/10)  Eye Contact Fair  Facial Expression Flat;Sad  Affect Appropriate to circumstance  Speech Logical/coherent  Interaction Assertive  Motor Activity Other (Comment) (WDL)  Appearance/Hygiene Unremarkable  Behavior Characteristics Cooperative  Mood Depressed;Sad  Thought Process  Coherency WDL  Content WDL  Delusions None reported or observed  Perception WDL  Hallucination None reported or observed  Judgment Impaired  Confusion None  Danger to Self  Current suicidal ideation? Denies  Description of Suicide Plan No plan  Self-Injurious Behavior No self-injurious ideation or behavior indicators observed or expressed   Agreement Not to Harm Self Yes  Description of Agreement Verbal  Danger to Others  Danger to Others None reported or observed

## 2023-05-25 DIAGNOSIS — F319 Bipolar disorder, unspecified: Secondary | ICD-10-CM

## 2023-05-25 MED ORDER — PRAZOSIN HCL 2 MG PO CAPS: 2.0000 mg | ORAL_CAPSULE | Freq: Every day | ORAL | 0 refills | Status: DC

## 2023-05-25 MED ORDER — FLUOXETINE HCL 20 MG PO CAPS: 20.0000 mg | ORAL_CAPSULE | Freq: Every day | ORAL | 0 refills | Status: DC

## 2023-05-25 MED ORDER — TRAZODONE HCL 50 MG PO TABS: 50.0000 mg | ORAL_TABLET | Freq: Every evening | ORAL | 0 refills | Status: DC | PRN

## 2023-05-25 MED ORDER — LURASIDONE HCL 60 MG PO TABS: 60.0000 mg | ORAL_TABLET | Freq: Every day | ORAL | 0 refills | Status: DC

## 2023-05-25 MED ORDER — HYDROXYZINE HCL 25 MG PO TABS: 25.0000 mg | ORAL_TABLET | Freq: Three times a day (TID) | ORAL | 0 refills | Status: DC | PRN

## 2023-05-25 NOTE — Progress Notes (Signed)
   05/25/23 0530  15 Minute Checks  Location Bedroom  Visual Appearance Calm  Behavior Sleeping  Sleep (Behavioral Health Patients Only)  Calculate sleep? (Click Yes once per 24 hr at 0600 safety check) Yes  Documented sleep last 24 hours 8

## 2023-05-25 NOTE — Progress Notes (Addendum)
 Patient discharged from [facility] on 05/25/23 at 1300. Patient denies SI, plan, and intention. Suicide safety plan completed, reviewed with this RN, given to the patient, and a copy in the chart. Patient denies HI/AVH upon discharge. Patient is alert, oriented, and cooperative. RN provided patient with discharge paperwork and reviewed information with patient. Patient expressed that she understood all of the discharge instructions. Pt was satisfied with belongings returned to her from the locker and at bedside. Discharged patient to [facility] waiting room. Pt's taxi driver  awaiting patient in the [facility] waiting room.

## 2023-05-25 NOTE — Transportation (Signed)
 05/25/2023  Wendy Pitts DOB: Feb 11, 1996 MRN: [id number]   RIDER WAIVER AND RELEASE OF LIABILITY  For the purposes of helping with transportation needs, [facility] partners with outside transportation providers (taxi companies, [place], Catering manager.) to give [name] patients or other approved people the choice of on-demand rides [name]") to our buildings for non-emergency visits.  By using [employer], I, the person signing this document, on behalf of myself and/or any legal minors (in my care using the [employer]), agree:  Science writer given to me are supplied by independent, outside transportation providers who do not work for, or have any affiliation with, [name]. [name] is not a transportation company. [name] has no control over the quality or safety of the rides I get using [employer]. [name] has no control over whether any outside ride will happen on time or not. [name] gives no guarantee on the reliability, quality, safety, or availability on any rides, or that no mistakes will happen. I know and accept that traveling by vehicle (car, truck, [employer]U, Zenaida Niece, bus, taxi, etc.) has risks of serious injuries such as disability, being paralyzed, and death. I know and agree the risk of using [employer] is mine alone, and not [name]. Transport Services are provided "as is" and as are available. The transportation providers are in charge for all inspections and care of the vehicles used to provide these rides. I agree not to take legal action against [name], its agents, employees, officers, directors, representatives, insurers, attorneys, assigns, successors, subsidiaries, and affiliates at any time for any reasons related directly or indirectly to using [employer]. I also agree not to take legal action against [name] or its affiliates for any injury, death, or damage to property caused by or related to using  [employer]. I have read this Waiver and Release of Liability, and I understand the terms used in it and their legal meaning. This Waiver is freely and voluntarily given with the understanding that my right (or any legal minors) to legal action against [name] relating to [employer] is knowingly given up to use these services.   I attest that I read the Ride Waiver and Release of Liability to [name], gave [name] the opportunity to ask questions and answered the questions asked (if any). I affirm that [name] then provided consent for assistance with transportation.

## 2023-05-25 NOTE — BHH Suicide Risk Assessment (Signed)
 [facility] Discharge Suicide Risk Assessment   Principal Problem: Bipolar 1 disorder, depressed (HCC)  Discharge Diagnoses: Principal Problem:   Bipolar 1 disorder, depressed (HCC)      Total Time spent with patient: 40  Musculoskeletal: Strength & Muscle Tone: within normal limits Gait & Station: normal Patient leans: N/A   Psychiatric Specialty Exam:  Presentation  General Appearance: Appropriate for Environment  Eye Contact: Good  Speech: Clear and Coherent; Normal Rate  Speech Volume: Normal  Handedness: Right   Mood and Affect  Mood: Euthymic  Affect: Congruent   Thought Process  Thought Processes: Linear  Descriptions of Associations: Intact  Orientation: Full (Time, Place and Person)  Thought Content: Logical  History of Schizophrenia/Schizoaffective disorder: No data recorded Duration of Psychotic Symptoms: NA Hallucinations: Hallucinations: None  Ideas of Reference: None  Suicidal Thoughts: Suicidal Thoughts: No  Homicidal Thoughts: Homicidal Thoughts: No   Sensorium  Memory: Immediate Good  Judgment: Poor  Insight: Good   Executive Functions  Concentration: Good  Attention Span: Good  Recall: Good  Fund of Knowledge: Good  Language: Good   Psychomotor Activity  Psychomotor Activity: Psychomotor Activity: Normal   Assets  Assets: Communication Skills; Social Support; Housing; Leisure Time   Sleep  Sleep: Sleep: Good Number of Hours of Sleep: 8   Physical Exam: General: Sitting comfortably. NAD. HEENT: Normocephalic, atraumatic, MMM, EMOI Lungs: no increased work of breathing noted Heart: no cyanosis Abdomen: Non distended Musculoskeletal: FROM. No obvious deformities Skin: Warm, dry, intact. No rashes noted Neuro: No obvious focal deficits.  Gait and station are normal  Review of Systems  Constitutional: Negative.   HENT: Negative.    Eyes: Negative.   Respiratory: Negative.    Cardiovascular: Negative.    Gastrointestinal: Negative.   Genitourinary: Negative.   Skin: Negative.   Neurological: Negative.   Psychiatric/Behavioral:  Negative   Mental Status Per Nursing Assessment: Suicidal ideation indicated by patient  Demographic Factors:  NA  Loss Factors: NA  Historical Factors: Impulsivity  Risk Reduction Factors:   Sense of responsibility to family and Positive social support  Continued Clinical Symptoms:  Previous psychiatric diagnoses and treatments  Cognitive Features That Contribute To Risk:  None  Suicide Risk:  Minimal: No identifiable suicidal ideation.  Patients presenting with no risk factors but with morbid ruminations; may be classified as minimal risk based on the severity of the depressive symptoms.    Follow-up Information     [facility]. Go on 06/03/2023.   Specialty: Behavioral Health Why: You have an appointment for therapy services on 06/03/23 at 8:00 am . The appointment will be held in person.  You also have an appointment for medication management services on 06/10/23 at 8:00 am, in person. Contact information: [address] [place] [address] [phone]                 Plan Of Care/Follow-up recommendations:  Activity: as tolerated  Diet: heart healthy  Other: -Follow-up with your outpatient psychiatric provider -instructions on appointment date, time, and address (location) are provided to you in discharge paperwork.  -Take your psychiatric medications as prescribed at discharge - instructions are provided to you in the discharge paperwork  -Follow-up with outpatient primary care doctor and other specialists -for management of preventative medicine and chronic medical issues  -Testing: Follow-up with outpatient provider for abnormal lab results: Hb 11.6  -If you are prescribed an atypical antipsychotic medication, we recommend that your outpatient psychiatrist  follow routine screening for side effects within 3 months of discharge, including monitoring: AIMS scale, height, weight, blood pressure, fasting lipid panel, HbA1c, and fasting blood sugar.   -Recommend total abstinence from alcohol, tobacco, and other illicit drug use at discharge.   -If your psychiatric symptoms recur, worsen, or if you have side effects to your psychiatric medications, call your outpatient psychiatric provider, 911, [phone] or go to the nearest emergency department.  -If suicidal thoughts occur, immediately call your outpatient psychiatric provider, 911, [phone] or go to the nearest emergency department.   Criss Alvine, MD 05/25/23 11:13 AM

## 2023-05-25 NOTE — Plan of Care (Signed)
   Problem: Education: Goal: Emotional status will improve Outcome: Progressing Goal: Mental status will improve Outcome: Progressing   Problem: Activity: Goal: Interest or engagement in activities will improve Outcome: Progressing Goal: Sleeping patterns will improve Outcome: Progressing

## 2023-05-25 NOTE — Group Note (Signed)
 Date:  05/25/2023 Time:  2:03 PM  Group Topic/Focus:  Goals Group:   The focus of this group is to help patients establish daily goals to achieve during treatment and discuss how the patient can incorporate goal setting into their daily lives to aide in recovery. Orientation:   The focus of this group is to educate the patient on the purpose and policies of crisis stabilization and provide a format to answer questions about their admission.  The group details unit policies and expectations of patients while admitted.    Participation Level:  Active  Participation Quality:  Appropriate  Affect:  Appropriate  Cognitive:  Appropriate  Insight: Appropriate  Engagement in Group:  Engaged  Modes of Intervention:  Discussion, Orientation, and Rapport Building  Additional Comments:   Pt attended and actively participated in the Orientation and Goals group. Pt personal goal for today is to have a successful discharge from treatment.   Wendy Pitts 05/25/2023, 2:03 PM

## 2023-05-25 NOTE — Progress Notes (Signed)
   05/25/23 0935  Psych Admission Type (Psych Patients Only)  Admission Status Voluntary  Psychosocial Assessment  Patient Complaints Anxiety;Depression  Eye Contact Fair  Facial Expression Flat  Affect Appropriate to circumstance  Speech Logical/coherent  Interaction Assertive  Motor Activity Other (Comment) (WDL)  Appearance/Hygiene Unremarkable  Behavior Characteristics Appropriate to situation  Mood Pleasant  Thought Process  Coherency WDL  Content WDL  Delusions None reported or observed  Perception WDL  Hallucination None reported or observed  Judgment Impaired  Confusion None  Danger to Self  Current suicidal ideation? Denies  Description of Suicide Plan No plan  Self-Injurious Behavior No self-injurious ideation or behavior indicators observed or expressed   Agreement Not to Harm Self Yes  Description of Agreement Verbally contracts for safety  Danger to Others  Danger to Others None reported or observed

## 2023-05-25 NOTE — Discharge Summary (Signed)
 Physician Discharge Summary Note  Patient:  Wendy Pitts is a 28 y.o. female  MRN:  [id number]  DOB:  Apr 21, 1995  Patient phone: [phone] (home)  Patient address:   [address] [place] [address]   Total Time spent with patient: 32 Minutes  Date of Admission:  05/19/2023  Date of Discharge: 05/25/23   Reason for Admission:  28 year old African-American female, single, recently unemployed, recently homeless.  Background history of trauma, PTSD, cluster B traits and bipolar disorder.  Presented to the hospital via emergency services.  Overdosed on 12,000 mg of ibuprofen.  Called [facility] herself.  Reports being overwhelmed by multiple psychosocial stressors. Routine labs are essentially within normal limits.  Toxicology does not show elevated acetaminophen level.  Normal liver function tests.  UDS positive for THC.  No detectable alcohol.   Chart reviewed today.  Patient discussed with staff.   Nursing staff reports that patient has been isolative in her room.  No observed response to internal stimuli.  No challenging behavior so far.   At interview with patient, she reports a strong family history of mood disorder on both sides of her family.  She reports early life trauma.  States that she was recently traumatized by people that she took care of.   Patient states that she had been living with her father until January 7.  States that she was her father's primary caregiver.  States that her father asked her to leave immediately he got back on his feet.  Patient states that taking care of her father cost her a relationship.  States that her boyfriend then decided to go a different direction.  Patient states that she used to work from home, becoming homeless affected her ability to work.  She lost her job on 6 February.   Patient states that it has been downward spiral since then.  States that she has been having mixed emotions.  She felt like not going on  anymore. The final straw that led to her overdose was being rejected by her current boyfriend.  Patient states that she was at his place when she initially took several pills of ibuprofen.  States that she then called [facility] and decided to take additional 8 pills since they did not show up on time.  Patient is currently remorseful of the overdose.  States that part of her wants to get better.  Patient is not expressing any current suicidal thoughts.  She is not expressing any violent thoughts towards others or to property.   Patient is not endorsing any delusions.  There are no hallucinations.  Patient is not experiencing any crowded thoughts.  There is no overwhelming anxiety.  She is not experiencing any acute PTSD symptoms.  She has been able to sleep better with trazodone.  Principal Problem: Bipolar 1 disorder, depressed (HCC)  Discharge Diagnoses: Principal Problem:   Bipolar 1 disorder, depressed (HCC)     Past Psychiatric (and medical) History: Wendy Pitts  has a past medical history of Anxiety and Bipolar 1 disorder (HCC).   Past Medical History:  Past Medical History:  Diagnosis Date   Anxiety    Bipolar 1 disorder (HCC)      History reviewed. No pertinent surgical history.   Family History:  Family History  Problem Relation Age of Onset   Bipolar disorder Mother      Family Psychiatric  History: Per HPI  Social History:  Social History   Substance and Sexual Activity  Alcohol Use No     Social History   Substance and Sexual Activity  Drug Use No     Social History   Socioeconomic History   Marital status: Single    Spouse name: Not on file   Number of children: Not on file   Years of education: Not on file   Highest education level: Not on file  Occupational History   Not on file  Tobacco Use   Smoking status: Never   Smokeless tobacco: Never  Vaping Use   Vaping status: Never Used  Substance and Sexual Activity   Alcohol use: No    Drug use: No   Sexual activity: Yes    Birth control/protection: None  Other Topics Concern   Not on file  Social History Narrative   Not on file   Social Drivers of Health   Financial Resource Strain: Not on file  Food Insecurity: Food Insecurity Present (05/19/2023)   Hunger Vital Sign    Worried About Running Out of Food in the Last Year: Sometimes true    Ran Out of Food in the Last Year: Sometimes true  Transportation Needs: Unmet Transportation Needs (05/19/2023)   PRAPARE - Administrator, Civil Service (Medical): Yes    Lack of Transportation (Non-Medical): Yes  Physical Activity: Not on file  Stress: Not on file  Social Connections: Not on file     Hospital Course:  During the patient's hospitalization, patient had extensive initial psychiatric evaluation, and follow-up psychiatric evaluations every day.  Psychiatric diagnoses provided upon initial assessment: Bipolar 1 disorder, depressed (HCC) [F31.9]   Patient's were adjusted and the patient was discharged on the medications listed above.  Patient's care was discussed during the interdisciplinary team meeting every day during the hospitalization.  The patient denied having side effects to prescribed psychiatric medication.  Gradually, patient started adjusting to milieu. The patient was evaluated each day by a clinical provider to ascertain response to treatment. Improvement was noted by the patient's report of decreasing symptoms, improved sleep and appetite, affect, medication tolerance, behavior, and participation in unit programming.  Patient was asked each day to complete a self inventory noting mood, mental status, pain, new symptoms, anxiety and concerns.    Symptoms were reported as significantly decreased or resolved completely by discharge.   On day of discharge, the patient reports that their mood is stable. The patient denied having suicidal thoughts for more than 48 hours prior to discharge.   Patient denies having homicidal thoughts.  Patient denies having auditory hallucinations.  Patient denies any visual hallucinations or other symptoms of psychosis. The patient was motivated to continue taking medication with a goal of continued improvement in mental health.   The patient reports their target psychiatric symptoms of depression, anxiety responded well to the psychiatric medications, and the patient reports overall benefit other psychiatric hospitalization. Supportive psychotherapy was provided to the patient. The patient also participated in regular group therapy while hospitalized. Coping skills, problem solving as well as relaxation therapies were also part of the unit programming.  Labs were reviewed with the patient, and abnormal results were discussed with the patient.  The patient is able to verbalize their individual safety plan to this provider.    Physical Findings:  AIMS:  Facial and Oral Movements: None Muscles of Facial Expression: None Lips and Perioral Area: None Jaw: None Tongue: None,Extremity Movements Upper (arms, wrists, hands, fingers): None Lower (legs, knees, ankles, toes): None, Trunk Movements Neck, shoulders, hips:  None, Global Judgements Severity of abnormal movements overall: None Incapacitation due to abnormal movements: None Patient's awareness of abnormal movements: No Awareness, Dental Status Current problems with teeth and/or dentures: No Does patient usually wear dentures: No Edentia: No   CIWA:   NA  COWS:  NA  Musculoskeletal: Strength & Muscle Tone: within normal limits Gait & Station: normal Patient leans: N/A    Psychiatric Specialty Exam:  Presentation  General Appearance: Appropriate for Environment  Eye Contact: Good  Speech: Clear and Coherent; Normal Rate  Speech Volume: Normal  Handedness: Right   Mood and Affect  Mood: Euthymic  Affect: Congruent   Thought Process  Thought Processes:  Linear  Descriptions of Associations: Intact  Orientation: Full (Time, Place and Person)  Thought Content: Logical  History of Schizophrenia/Schizoaffective disorder: No data recorded Duration of Psychotic Symptoms: NA Hallucinations: Hallucinations: None  Ideas of Reference: None  Suicidal Thoughts: Suicidal Thoughts: No  Homicidal Thoughts: Homicidal Thoughts: No   Sensorium  Memory: Immediate Good  Judgment: Poor  Insight: Good   Executive Functions  Concentration: Good  Attention Span: Good  Recall: Good  Fund of Knowledge: Good  Language: Good   Psychomotor Activity  Psychomotor Activity: Psychomotor Activity: Normal   Assets  Assets: Communication Skills; Social Support; Housing; Leisure Time   Sleep  Sleep: Sleep: Good Number of Hours of Sleep: 8      Physical Exam: General: Sitting comfortably. NAD. HEENT: Normocephalic, atraumatic, MMM, EMOI Lungs: no increased work of breathing noted Heart: no cyanosis Abdomen: Non distended Musculoskeletal: FROM. No obvious deformities Skin: Warm, dry, intact. No rashes noted Neuro: No obvious focal deficits.  Gait and station are normal  Review of Systems:  Constitutional: Negative.   HENT: Negative.    Eyes: Negative.   Respiratory: Negative.    Cardiovascular: Negative.   Gastrointestinal: Negative.   Genitourinary: Negative.   Skin: Negative.   Neurological: Negative.   Psychiatric/Behavioral:  Negative  Blood pressure (!) 140/84, pulse (!) 101, temperature 98.8 F (37.1 C), temperature source Oral, resp. rate 18, height 5\' 4"  (1.626 m), weight (!) 144.2 kg, SpO2 100%. Body mass index is 54.58 kg/m.    Social History   Tobacco Use  Smoking Status Never  Smokeless Tobacco Never     Tobacco Cessation:  A prescription for an FDA approved medication for tobacco cessation was not prescribed because: Non-smoker   Blood Alcohol level:  Lab Results  Component Value Date   ETH <10  05/18/2023    Metabolic Disorder Labs:  No results found for: "HGBA1C", "MPG" No results found for: "PROLACTIN"  No results found for: "CHOL", "TRIG", "HDL", "VLDL", "LDLCALC"    See Psychiatric Specialty Exam and Suicide Risk Assessment completed by Attending Physician prior to discharge.  Discharge destination: Home  Is patient on multiple antipsychotic therapies at discharge:  No  Has Patient had three or more failed trials of antipsychotic monotherapy by history: NA Recommended Plan for Multiple Antipsychotic Therapies: NA   Discharge Instructions     Diet - low sodium heart healthy   Complete by: As directed    Increase activity slowly   Complete by: As directed         Allergies as of 05/25/2023   No Known Allergies      Medication List     STOP taking these medications    hydrOXYzine 25 MG capsule Commonly known as: VISTARIL Replaced by: hydrOXYzine 25 MG tablet       TAKE these medications  Indication  FLUoxetine 20 MG capsule Commonly known as: PROzac Take 1 capsule (20 mg total) by mouth daily.  Indication: Depression   hydrOXYzine 25 MG tablet Commonly known as: ATARAX Take 1 tablet (25 mg total) by mouth 3 (three) times daily as needed for anxiety. Replaces: hydrOXYzine 25 MG capsule  Indication: Feeling Anxious, Feeling Tense   Lurasidone HCl 60 MG Tabs Take 1 tablet (60 mg total) by mouth at bedtime.  Indication: MIXED BIPOLAR AFFECTIVE DISORDER   prazosin 2 MG capsule Commonly known as: MINIPRESS Take 1 capsule (2 mg total) by mouth at bedtime.  Indication: Frightening Dreams   traZODone 50 MG tablet Commonly known as: DESYREL Take 1 tablet (50 mg total) by mouth at bedtime as needed for sleep.  Indication: Trouble Sleeping          Follow-up Information     [facility]. Go on 06/03/2023.   Specialty: Behavioral Health Why: You have an appointment for therapy services on 06/03/23  at 8:00 am . The appointment will be held in person.  You also have an appointment for medication management services on 06/10/23 at 8:00 am, in person. Contact information: [address] [place] [address] [phone]                   Follow-up recommendations:  - It is recommended to the patient to continue psychiatric medications as prescribed, after discharge from the hospital.   - It is recommended to the patient to follow up with your outpatient psychiatric provider and PCP. - It was discussed with the patient, the impact of alcohol, drugs, tobacco have been there overall psychiatric and medical wellbeing, and total abstinence from substance use was recommended the patient. - Prescriptions provided or sent directly to preferred pharmacy at discharge. Patient agreeable to plan. Given opportunity to ask questions. Appears to feel comfortable with discharge.   - In the event of worsening symptoms, the patient is instructed to call the crisis hotline, 911 and or go to the nearest ED for appropriate evaluation and treatment of symptoms. To follow-up with primary care provider for other medical issues, concerns and or health care needs - Patient was discharged home with a plan to follow up as noted above.   Comments:  NA  Signed: Criss Alvine, MD 05/25/23 1:56 PM

## 2023-05-25 NOTE — Plan of Care (Signed)
   Problem: Education: Goal: Mental status will improve Outcome: Progressing   Problem: Activity: Goal: Interest or engagement in activities will improve Outcome: Progressing

## 2023-05-25 NOTE — Plan of Care (Signed)
  Problem: Education: Goal: Emotional status will improve 05/25/2023 1101 by Huntley Dec, RN Outcome: Progressing 05/25/2023 0939 by Huntley Dec, RN Outcome: Progressing Goal: Mental status will improve 05/25/2023 1101 by Huntley Dec, RN Outcome: Progressing 05/25/2023 0939 by Huntley Dec, RN Outcome: Progressing   Problem: Activity: Goal: Interest or engagement in activities will improve Outcome: Progressing Goal: Sleeping patterns will improve Outcome: Progressing

## 2023-05-25 NOTE — Progress Notes (Signed)
  [facility] Adult Case Management Discharge Plan :  Will you be returning to the same living situation after discharge:  Yes,  going to grandmother's home. At discharge, do you have transportation home?: No. CSW arranged Taxi Voucher to [employer]: [address] [place], [place] for [employer]. Do you have the ability to pay for your medications: Yes,  insurance benefits confirmed active.  Release of information consent forms completed and in the chart;  Patient's signature needed at discharge.  Patient to Follow up at:  Follow-up Information     [facility]. Go on 06/03/2023.   Specialty: Behavioral Health Why: You have an appointment for therapy services on 06/03/23 at 8:00 am . The appointment will be held in person.  You also have an appointment for medication management services on 06/10/23 at 8:00 am, in person. Contact information: [address] [place] [address] [phone]                Next level of care provider has access to [facility] Link:no  Safety Planning and Suicide Prevention discussed: Yes,  completed with pt  Has patient been referred to the Quitline?: Patient does not use tobacco/nicotine products  Patient has been referred for addiction treatment: No known substance use disorder.  Joelyn Oms Mustaf Antonacci, LCSW 05/25/2023, 8:56 AM

## 2023-06-03 ENCOUNTER — Telehealth (HOSPITAL_COMMUNITY): Payer: Self-pay | Admitting: Psychiatry

## 2023-06-03 ENCOUNTER — Ambulatory Visit (INDEPENDENT_AMBULATORY_CARE_PROVIDER_SITE_OTHER): Payer: 59 | Admitting: Licensed Clinical Social Worker

## 2023-06-03 DIAGNOSIS — F902 Attention-deficit hyperactivity disorder, combined type: Secondary | ICD-10-CM | POA: Insufficient documentation

## 2023-06-03 DIAGNOSIS — F319 Bipolar disorder, unspecified: Secondary | ICD-10-CM | POA: Diagnosis not present

## 2023-06-03 DIAGNOSIS — F4312 Post-traumatic stress disorder, chronic: Secondary | ICD-10-CM | POA: Diagnosis not present
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
## 2023-06-04 ENCOUNTER — Other Ambulatory Visit (HOSPITAL_COMMUNITY): Payer: 59

## 2023-06-05 ENCOUNTER — Telehealth (HOSPITAL_COMMUNITY): Payer: Self-pay | Admitting: Psychiatry

## 2023-06-05 ENCOUNTER — Other Ambulatory Visit (HOSPITAL_COMMUNITY): Payer: 59 | Admitting: Psychiatry

## 2023-06-07 NOTE — Progress Notes (Unsigned)
 Psychiatric Initial Adult Assessment   Virtual Visit via Video Note   I connected with Wendy Pitts on 06/07/2023,  9:00 AM EST by a video enabled telemedicine application and verified that I am speaking with the correct person using two identifiers.   Location: Patient: Home Provider: Clinic   I discussed the limitations of evaluation and management by telemedicine and the availability of in person appointments. The patient expressed understanding and agreed to proceed.   Follow Up Instructions:   I discussed the assessment and treatment plan with the patient. The patient was provided an opportunity to ask questions and all were answered. The patient agreed with the plan and demonstrated an understanding of the instructions.   The patient was advised to call back or seek an in-person evaluation if the symptoms worsen or if the condition fails to improve as anticipated.   Park Pope, MD  Patient Identification: Wendy Pitts MRN:  [id number] Date of Evaluation:  06/08/2023 Referral Source: Cyril Loosen, LCSW Chief Complaint:    Visit Diagnosis: No diagnosis found.  History of Present Illness:  [name] is a 28 y.o. female with psychiatric history of     Associated Signs/Symptoms: Depression Symptoms:  {DEPRESSION SYMPTOMS:20000} (Hypo) Manic Symptoms:  {BHH MANIC SYMPTOMS:22872} Anxiety Symptoms:  {BHH ANXIETY SYMPTOMS:22873} Psychotic Symptoms:  {BHH PSYCHOTIC SYMPTOMS:22874} PTSD Symptoms: {BHH PTSD SYMPTOMS:22875}  Past Psychiatric History:  Patient has an established history of bipolar disorder.  She has had hypomanic episodes in the past.  Never really had any manic episode.  She has also been diagnosed with PTSD, anxiety disorder and borderline personality disorder in the past.  No prior hospitalization. Patient has a history of cutting herself twice.  First episode was when she was in high school.  The second episode was in 2019.  No other form of suicidal behavior  until recent overdose. No past history of being violent.  No past history of psychosis. Patient has a history of THC use.  No use of any other psychoactive substance.  No past addiction treatment. She has been tried on aripiprazole which made her restless.  Reports trial of Adderall and diazepam in the past.  No recollection of other medicines tried. Patient has been in therapy in the past.  She is not currently in therapy. No neuromodulation in the past.  Substance Use History: EtOH:  reports no history of alcohol use.*** Nicotine:  reports that she has never smoked. She has never used smokeless tobacco.*** Marijuana: *** IV drug use: *** Stimulants: *** Opiates: *** Sedative/hypnotics: *** Hallucinogens: *** DT: *** Detox: *** Residential: ***  Past Medical History: Dx:  has a past medical history of Anxiety and Bipolar 1 disorder (HCC).  Head trauma: *** Seizures: *** Allergies: Patient has no known allergies.   Family Psychiatric History:  Suicide: *** Homicide: *** Psych hospitalization: *** BiPD: *** SCZ/SCzA: *** Substance use: *** Others: ***  Social History:  Housing: *** Income: *** Family: *** Education: *** Marital Status: *** Children: *** Support: *** Legal: *** DUI/DWI: *** Jail/prison: *** Developmental: *** Other: ***  Previous Psychotropic Medications: {YES/NO:21197}  Substance Abuse History in the last 12 months:  {yes no:314532}  Consequences of Substance Abuse: {BHH CONSEQUENCES OF SUBSTANCE ABUSE:22880}  Past Medical History:  Past Medical History:  Diagnosis Date   Anxiety    Bipolar 1 disorder (HCC)    No past surgical history on file.  Family History:  Family History  Problem Relation Age of Onset   Bipolar disorder Mother  Social History:   Social History   Socioeconomic History   Marital status: Single    Spouse name: Not on file   Number of children: Not on file   Years of education: Not on file   Highest  education level: Not on file  Occupational History   Not on file  Tobacco Use   Smoking status: Never   Smokeless tobacco: Never  Vaping Use   Vaping status: Never Used  Substance and Sexual Activity   Alcohol use: No   Drug use: No   Sexual activity: Yes    Birth control/protection: None  Other Topics Concern   Not on file  Social History Narrative   Not on file   Social Drivers of Health   Financial Resource Strain: Not on file  Food Insecurity: Food Insecurity Present (05/19/2023)   Hunger Vital Sign    Worried About Running Out of Food in the Last Year: Sometimes true    Ran Out of Food in the Last Year: Sometimes true  Transportation Needs: Unmet Transportation Needs (05/19/2023)   PRAPARE - Administrator, Civil Service (Medical): Yes    Lack of Transportation (Non-Medical): Yes  Physical Activity: Not on file  Stress: Not on file  Social Connections: Not on file   Allergies:  No Known Allergies  Metabolic Disorder Labs: No results found for: "HGBA1C", "MPG" No results found for: "PROLACTIN" No results found for: "CHOL", "TRIG", "HDL", "CHOLHDL", "VLDL", "LDLCALC" No results found for: "TSH"  Therapeutic Level Labs: No results found for: "LITHIUM" No results found for: "CBMZ" No results found for: "VALPROATE"  Current Medications: Current Outpatient Medications  Medication Sig Dispense Refill   FLUoxetine (PROZAC) 20 MG capsule Take 1 capsule (20 mg total) by mouth daily. 30 capsule 0   hydrOXYzine (ATARAX) 25 MG tablet Take 1 tablet (25 mg total) by mouth 3 (three) times daily as needed for anxiety. 30 tablet 0   Lurasidone HCl 60 MG TABS Take 1 tablet (60 mg total) by mouth at bedtime. 30 tablet 0   prazosin (MINIPRESS) 2 MG capsule Take 1 capsule (2 mg total) by mouth at bedtime. 30 capsule 0   traZODone (DESYREL) 50 MG tablet Take 1 tablet (50 mg total) by mouth at bedtime as needed for sleep. 14 tablet 0   No current facility-administered  medications for this visit.   VIRTUAL VISIT, LIMITED ASSESSMENT Musculoskeletal: Strength & Muscle Tone: unable to assess Gait & Station: unable to assess Patient leans: unable to assess  Psychiatric Specialty Exam: General Appearance: Casual, faily groomed  Eye Contact:  Good    Speech:  Clear, coherent, normal rate   Volume:  Normal   Mood:  "***"  Affect:  Appropriate, congruent, full range  Thought Content: Logical, rumination  Suicidal Thoughts: Denied active SI, *** passive SI ***   Thought Process:  Coherent, goal-directed, linear ***  Orientation:  A&Ox4   Memory:  Immediate good  Judgment:  Fair   Insight:  Shallow ***  Concentration:  Attention and concentration good ***  Recall:  Good  Fund of Knowledge: Good  Language: Good, fluent  Psychomotor Activity: grossly appears normal  Akathisia:  NA ***  AIMS (if indicated): NA ***  Assets:  {Assets (PAA):22698}  ADL's:  Intact  Cognition: WNL  Sleep:  ***    Screenings: AUDIT    Flowsheet Row [facility]  Alcohol Use Disorder Identification Test Final Score (AUDIT)  0      GAD-7    Flowsheet Row Counselor from 06/03/2023 in [facility]  Total GAD-7 Score 18      PHQ2-9    Flowsheet Row Counselor from 06/03/2023 in [facility]  PHQ-2 Total Score 1 4  PHQ-9 Total Score 12 13      Flowsheet Row Ad[facility] ([facility] ED from [facility]18/2023 in [facility] Office Visit from 12/15/2022 in [facility]  C-SSRS RISK CATEGORY High Risk High Risk Error: Q7 should not be populated when Q6 is No       Assessment and Plan:  ***   Collaboration of Care: Case was staffed with attending, see attestation per above.   Patient/Guardian was advised Release of Information must be obtained prior to any record release in order to collaborate their care with an outside provider. Patient/Guardian was advised if they have not already done so to contact the registration department to sign all necessary forms in order for [place] to release information regarding their care.   Consent: Patient/Guardian gives verbal consent for treatment and assignment of benefits for services provided during this visit. Patient/Guardian expressed understanding and agreed to proceed.   Park Pope, MD

## 2023-06-08 ENCOUNTER — Other Ambulatory Visit (HOSPITAL_COMMUNITY): Payer: 59 | Attending: Psychiatry | Admitting: Psychiatry

## 2023-06-08 ENCOUNTER — Encounter (HOSPITAL_COMMUNITY): Payer: Self-pay | Admitting: Psychiatry

## 2023-06-08 ENCOUNTER — Encounter (HOSPITAL_COMMUNITY): Payer: Self-pay

## 2023-06-08 ENCOUNTER — Telehealth (HOSPITAL_COMMUNITY): Payer: Self-pay | Admitting: Psychiatry

## 2023-06-08 DIAGNOSIS — F319 Bipolar disorder, unspecified: Secondary | ICD-10-CM | POA: Diagnosis present

## 2023-06-08 DIAGNOSIS — F909 Attention-deficit hyperactivity disorder, unspecified type: Secondary | ICD-10-CM | POA: Insufficient documentation

## 2023-06-08 DIAGNOSIS — F419 Anxiety disorder, unspecified: Secondary | ICD-10-CM | POA: Diagnosis not present

## 2023-06-08 DIAGNOSIS — F4312 Post-traumatic stress disorder, chronic: Secondary | ICD-10-CM

## 2023-06-08 DIAGNOSIS — Z560 Unemployment, unspecified: Secondary | ICD-10-CM | POA: Insufficient documentation

## 2023-06-08 DIAGNOSIS — Z599 Problem related to housing and economic circumstances, unspecified: Secondary | ICD-10-CM | POA: Diagnosis not present

## 2023-06-08 DIAGNOSIS — F902 Attention-deficit hyperactivity disorder, combined type: Secondary | ICD-10-CM

## 2023-06-08 NOTE — Progress Notes (Signed)
 Virtual Visit via Video Note   I connected with Wendy Pitts, who prefers to go by "Wendy Pitts" on 06/08/23 at  9:00 AM EDT by a video enabled telemedicine application and verified that I am speaking with the correct person using two identifiers.   At orientation to the IOP program, Case Manager discussed the limitations of evaluation and management by telemedicine and the availability of in person appointments. The patient expressed understanding and agreed to proceed with virtual visits throughout the duration of the program.   Location:  Patient: Patient Home Provider: OPT BH Office   History of Present Illness: Bipolar I Disorder, PTSD and ADHD   Observations/Objective: Check In: Case Manager checked in with all participants to review discharge dates, insurance authorizations, work-related documents and needs from the treatment team regarding medications. Wendy Pitts stated needs and engaged in discussion.    Initial Therapeutic Activity: Counselor facilitated a check-in with Breann, who prefers to go by "Wendy Pitts", to assess for safety, sobriety and medication compliance.  Counselor also inquired about Nina's current emotional ratings, as well as any significant changes in thoughts, feelings or behavior since previous check in.  Wendy Pitts presented for session on time and was alert, oriented x5, with no evidence or self-report of active SI/HI or A/V H.  Wendy Pitts reported compliance with medication and denied use of alcohol or illicit substances.  Wendy Pitts reported scores of 3/10 for depression, 8/10 for anxiety, and 0/10 for anger/irritability.  Wendy Pitts denied any recent outbursts or panic attacks.  Wendy Pitts reported that a struggle has been dealing with mood episodes, including mania.  Wendy Pitts reported that a success was noticing improved mood since leaving the hospital and starting medication.  Wendy Pitts reported that her goal today is to spend time with her mother looking at homes and putting in applications.          Second  Therapeutic Activity: Counselor engaged the group in discussion on managing work/life balance today to improve mental health and wellness.  Counselor explained how finding balance between responsibilities at home and work place can be challenging, and lead to increased stress.  Counselor facilitated discussion on what challenges members are currently, or have historically faced.  Counselor also discussed strategies for improving work/life balance while members work on their mental health during treatment.  Some of these included keeping track of time management; creating a list of priorities and scaling importance; setting realistic, measurable goals each day; establishing boundaries; taking care of health needs; and nurturing relationships at home and work for support.  Counselor inquired about areas where members feel they are excelling, as well as areas they could focus on during treatment. Intervention was effective, as evidenced by Wendy Pitts actively participating in discussion on topic and reporting that she was recently terminated from her job of 5 years in February, and has been contemplating alternative career options to explore, such as truck driving.  Wendy Pitts stated "When I was working, it felt like I never had enough time to focus on my mental health".  Wendy Pitts reported that she experienced several symptoms of burnout, including isolation, procrastination, feeling tired and drained, lacking motivation, and frequent headaches.  Wendy Pitts reported that there have also been numerous warning signs such as regularly working through lunch, pulling out of social engagements, feeling resentful about the amount of time spent at, and thinking about her job.  Wendy Pitts was receptive to suggestions offered today for addressing work life imbalance, including keeping an activities time log for a week, focusing more on personal priorities, creating  a to-do list each day to become more organized, leaving work at work when shifts are over to  avoid overthinking, and setting boundaries to avoid saying "Yes" to unreasonable work tasks.    Assessment and Plan: Counselor recommends that Wendy Pitts remain in IOP treatment to better manage mental health symptoms, ensure stability and pursue completion of treatment plan goals. Counselor recommends adherence to crisis/safety plan, taking medications as prescribed, and following up with medical professionals if any issues arise.    Follow Up Instructions: Counselor will send Webex link for session tomorrow.  Wendy Pitts was advised to call back or seek an in-person evaluation if the symptoms worsen or if the condition fails to improve as anticipated.   Collaboration of Care:   Medication Management AEB Hillery Jacks, NP or Dr. Park Pope                                          Case Manager AEB Jeri Modena, CNA    Patient/Guardian was advised Release of Information must be obtained prior to any record release in order to collaborate their care with an outside provider. Patient/Guardian was advised if they have not already done so to contact the registration department to sign all necessary forms in order for [place] to release information regarding their care.    Consent: Patient/Guardian gives verbal consent for treatment and assignment of benefits for services provided during this visit. Patient/Guardian expressed understanding and agreed to proceed.   I provided 180 minutes of non-face-to-face time during this encounter.   Noralee Stain, LCSW, LCAS 06/08/23
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
## 2023-06-08 NOTE — Telephone Encounter (Signed)
 D:  Placed call to [employer] ([phone]) spoke with [employer] (ref# for phone call is [phone]) to get pre-authorization for virtual MH-IOP.  According to [facility] pt's plan terminated on 06-05-23.  A:  Placed call to inform pt; pt was already aware.  "I am under COBRA until I find another plan.  I will be calling around today."  Informed pt about [facility]'s financial assistance program.  Strongly encouraged pt to apply.  Informed treatment team, pt's therapist Cyril Loosen, LCSW) and Caryl Asp (Business Mgr).  R: Pt receptive.

## 2023-06-09 ENCOUNTER — Telehealth (HOSPITAL_COMMUNITY): Payer: Self-pay | Admitting: Psychiatry

## 2023-06-09 ENCOUNTER — Other Ambulatory Visit (HOSPITAL_COMMUNITY): Payer: 59 | Admitting: Psychiatry

## 2023-06-09 NOTE — Telephone Encounter (Signed)
 D: Pt didn't show for virtual MH-IOP nor did she call the MH-IOP case manager.  Pt's insurance had terminated as of 06-05-23.  This was brought to patient's attention yesterday.  Pt informed cm that she has Cobra.  Case manager requested pt to send proof of Cobra yesterday; as of yet cm hasn't received any email or fax from patient. A:  Will request the group facilitator Noralee Stain, [place]) to send patient another group link invite for tomorrow, just in case patient provides the proof of Cobra today or tomorrow morning.  Inform treatment team, Cyril Loosen, LCSW and management.

## 2023-06-10 ENCOUNTER — Other Ambulatory Visit: Payer: Self-pay

## 2023-06-10 ENCOUNTER — Telehealth (HOSPITAL_COMMUNITY): Payer: Self-pay | Admitting: Family

## 2023-06-10 ENCOUNTER — Telehealth (HOSPITAL_COMMUNITY): Payer: Self-pay | Admitting: Psychiatry

## 2023-06-10 ENCOUNTER — Ambulatory Visit (HOSPITAL_BASED_OUTPATIENT_CLINIC_OR_DEPARTMENT_OTHER): Payer: 59 | Admitting: Family

## 2023-06-10 ENCOUNTER — Other Ambulatory Visit (HOSPITAL_COMMUNITY): Payer: 59

## 2023-06-10 ENCOUNTER — Encounter (HOSPITAL_COMMUNITY): Payer: Self-pay | Admitting: Family

## 2023-06-10 VITALS — BP 102/71 | HR 80 | Ht 64.0 in | Wt 330.0 lb

## 2023-06-10 DIAGNOSIS — F331 Major depressive disorder, recurrent, moderate: Secondary | ICD-10-CM

## 2023-06-10 DIAGNOSIS — F319 Bipolar disorder, unspecified: Secondary | ICD-10-CM

## 2023-06-10 MED ORDER — TRAZODONE HCL 50 MG PO TABS: 50.0000 mg | ORAL_TABLET | Freq: Every evening | ORAL | 0 refills | Status: DC | PRN

## 2023-06-10 MED ORDER — LURASIDONE HCL 60 MG PO TABS: 60.0000 mg | ORAL_TABLET | Freq: Every day | ORAL | 1 refills | Status: AC

## 2023-06-10 MED ORDER — FLUOXETINE HCL 20 MG PO CAPS: 20.0000 mg | ORAL_CAPSULE | Freq: Every day | ORAL | 1 refills | Status: AC

## 2023-06-10 MED ORDER — HYDROXYZINE HCL 25 MG PO TABS: 25.0000 mg | ORAL_TABLET | Freq: Three times a day (TID) | ORAL | 0 refills | Status: DC | PRN

## 2023-06-10 MED ORDER — FLUOXETINE HCL 20 MG PO CAPS: 20.0000 mg | ORAL_CAPSULE | Freq: Every day | ORAL | 0 refills | Status: DC

## 2023-06-10 MED ORDER — TRAZODONE HCL 50 MG PO TABS
50.0000 mg | ORAL_TABLET | Freq: Every evening | ORAL | 0 refills | Status: AC | PRN
Start: 2023-06-10 — End: ?

## 2023-06-10 MED ORDER — LURASIDONE HCL 60 MG PO TABS
60.0000 mg | ORAL_TABLET | Freq: Every day | ORAL | 0 refills | Status: DC
Start: 2023-06-10 — End: 2023-06-10

## 2023-06-10 MED ORDER — HYDROXYZINE HCL 25 MG PO TABS: 25.0000 mg | ORAL_TABLET | Freq: Three times a day (TID) | ORAL | 1 refills | Status: DC | PRN
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
## 2023-06-10 NOTE — Telephone Encounter (Signed)
 D:  Pt's outpatient provider Hillery Jacks, NP) called the MH-IOP Case Mgr to state that pt was being seen by her in the clinic and she was verifying if pt was a current pt in MH-IOP.  Informed Alcario Drought that the case mgr has been awaiting proof from pt that she has COBRA.  Tanika asked the pt if she still has COBRA and pt replied "no." A:  Case manager will go ahead and discharge patient from virtual MH-IOP today.  Pt only attended virtual MH-IOP for one day (06-08-23).  Inform treatment team and management re: lack of COBRA.
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
## 2023-06-10 NOTE — Patient Instructions (Signed)
 D:  Pt never returned to virtual MH-IOP.  Never provided proof of COBRA. Attended group one day only.  A:  D/C.  F/U with Hillery Jacks, NP and Cyril Loosen, LCSW.  Strongly recommended support groups through [facility]

## 2023-06-10 NOTE — Telephone Encounter (Signed)
 8:23am 06/10/23 Informed the patient since current insurance has lapsed and she no longer has [employer], the visits will be self-pay.Marland KitchenMarguerite Olea

## 2023-06-11 ENCOUNTER — Other Ambulatory Visit (HOSPITAL_COMMUNITY): Payer: 59

## 2023-06-12 ENCOUNTER — Other Ambulatory Visit (HOSPITAL_COMMUNITY): Payer: 59

## 2023-06-15 ENCOUNTER — Other Ambulatory Visit (HOSPITAL_COMMUNITY): Payer: 59

## 2023-06-16 ENCOUNTER — Other Ambulatory Visit (HOSPITAL_COMMUNITY): Payer: 59

## 2023-06-17 ENCOUNTER — Other Ambulatory Visit (HOSPITAL_COMMUNITY): Payer: 59

## 2023-06-18 ENCOUNTER — Other Ambulatory Visit (HOSPITAL_COMMUNITY): Payer: 59

## 2023-06-19 ENCOUNTER — Encounter (HOSPITAL_COMMUNITY): Payer: Self-pay

## 2023-06-19 ENCOUNTER — Ambulatory Visit (HOSPITAL_COMMUNITY)
Admission: EM | Admit: 2023-06-19 | Discharge: 2023-06-19 | Disposition: A | Discharge status: Home / Self Care | Attending: Family Medicine | Admitting: Family Medicine

## 2023-06-19 ENCOUNTER — Other Ambulatory Visit (HOSPITAL_COMMUNITY): Payer: 59

## 2023-06-19 DIAGNOSIS — M7661 Achilles tendinitis, right leg: Secondary | ICD-10-CM

## 2023-06-19 DIAGNOSIS — M7662 Achilles tendinitis, left leg: Secondary | ICD-10-CM | POA: Diagnosis not present

## 2023-06-19 MED ORDER — NAPROXEN 500 MG PO TABS: 500.0000 mg | ORAL_TABLET | Freq: Two times a day (BID) | ORAL | 0 refills | Status: AC

## 2023-06-19 NOTE — ED Triage Notes (Signed)
 Patient presenting with pain in the back of the lower calf bilaterally onset 1 week ago. States a bump that feels as though it is filled with fluid has came up in the same area, larger on the left leg.   Prescriptions or OTC medications tried: Yes- BC powder     with little relief

## 2023-06-19 NOTE — Discharge Instructions (Addendum)
 You were seen today for bilateral achilles pain.  I think this is an achilles tendonitis.  I have given you information on this day. I have sent out anti-inflammatories to take twice/day for your pain.  I recommend you rest and use ice.  You should get a good pair of shoes with good support as well.  If you continue with pain, please follow up with a podiatrist.  You may call [facility] at  [phone] for an appointment if needed.

## 2023-06-19 NOTE — ED Provider Notes (Signed)
 [facility]    CSN: [id number] Arrival date & time: 06/19/23  1233      History   Chief Complaint Chief Complaint  Patient presents with   Leg Pain    HPI Wendy Pitts is a 28 y.o. female.    Leg Pain  Patient is here for bilateral calf pain x 2 weeks.   No injury, no increased activity.   She feels this pain is at the achilles area bilaterally.  It makes her foot feel swollen.  The 1st week she was trying to lay low.  She take BC powder with some help, but nothing today.  She is not working.       Past Medical History:  Diagnosis Date   Anxiety    Bipolar 1 disorder [facility])     Patient Active Problem List   Diagnosis Date Noted   Chronic post-traumatic stress disorder (PTSD) 06/03/2023   Attention deficit hyperactivity disorder (ADHD), combined type 06/03/2023   Bipolar 1 disorder, depressed (HCC) 05/19/2023    History reviewed. No pertinent surgical history.  OB History   No obstetric history on file.      Home Medications    Prior to Admission medications   Medication Sig Start Date End Date Taking? Authorizing Provider  JUNEL FE 1/20 1-20 MG-MCG tablet Take 1 tablet by mouth daily. 06/04/23  Yes [provider]  FLUoxetine (PROZAC) 20 MG capsule Take 1 capsule (20 mg total) by mouth daily. 06/10/23  Yes Oneta Rack, NP  hydrOXYzine (ATARAX) 25 MG tablet Take 1 tablet (25 mg total) by mouth 3 (three) times daily as needed for anxiety. 06/10/23  Yes Oneta Rack, NP  Lurasidone HCl 60 MG TABS Take 1 tablet (60 mg total) by mouth at bedtime. 06/10/23  Yes Oneta Rack, NP  traZODone (DESYREL) 50 MG tablet Take 1 tablet (50 mg total) by mouth at bedtime as needed for sleep. 06/10/23  Yes Oneta Rack, NP    Family History Family History  Problem Relation Age of Onset   Bipolar disorder Mother     Social History Social History   Tobacco Use   Smoking status: Never   Smokeless tobacco: Never  Vaping Use   Vaping  status: Never Used  Substance Use Topics   Alcohol use: No   Drug use: No     Allergies   Patient has no known allergies.   Review of Systems Review of Systems  Constitutional: Negative.   HENT: Negative.    Respiratory: Negative.    Cardiovascular: Negative.   Gastrointestinal: Negative.   Genitourinary: Negative.      Physical Exam Triage Vital Signs ED Triage Vitals  Encounter Vitals Group     BP 06/19/23 1419 125/81     Systolic BP Percentile --      Diastolic BP Percentile --      Pulse Rate 06/19/23 1419 93     Resp 06/19/23 1419 18     Temp 06/19/23 1419 98.2 F (36.8 C)     Temp Source 06/19/23 1419 Oral     SpO2 06/19/23 1419 96 %     Weight 06/19/23 1419 (!) 330 lb 0.5 oz (149.7 kg)     Height 06/19/23 1419 5\' 4"  (1.626 m)     Head Circumference --      Peak Flow --      Pain Score 06/19/23 1417 5     Pain Loc --      Pain  Education --      Exclude from [employer] Chart --    No data found.  Updated Vital Signs BP 125/81 (BP Location: Left Wrist)   Pulse 93   Temp 98.2 F (36.8 C) (Oral)   Resp 18   Ht 5\' 4"  (1.626 m)   Wt (!) 149.7 kg   LMP 05/25/2023 (Approximate)   SpO2 96%   BMI 56.65 kg/m   Visual Acuity Right Eye Distance:   Left Eye Distance:   Bilateral Distance:    Right Eye Near:   Left Eye Near:    Bilateral Near:     Physical Exam Constitutional:      Appearance: Normal appearance. She is normal weight.  Cardiovascular:     Rate and Rhythm: Normal rate and regular rhythm.  Pulmonary:     Effort: Pulmonary effort is normal.     Breath sounds: Normal breath sounds.  Musculoskeletal:     Comments: No calf swelling or tenderness;  She has tenderness and slight swelling to the achilles bilaterally;  pain with dorsiflexion of the feet  Neurological:     General: No focal deficit present.     Mental Status: She is alert.  Psychiatric:        Mood and Affect: Mood normal.      UC Treatments / Results  Labs (all labs  ordered are listed, but only abnormal results are displayed) Labs Reviewed - No data to display  EKG   Radiology No results found.  Procedures Procedures (including critical care time)  Medications Ordered in UC Medications - No data to display  Initial Impression / Assessment and Plan / UC Course  I have reviewed the triage vital signs and the nursing notes.  Pertinent labs & imaging results that were available during my care of the patient were reviewed by me and considered in my medical decision making (see chart for details).   Final Clinical Impressions(s) / UC Diagnoses   Final diagnoses:  Achilles tendonitis, bilateral     Discharge Instructions      You were seen today for bilateral achilles pain.  I think this is an achilles tendonitis.  I have given you information on this day. I have sent out anti-inflammatories to take twice/day for your pain.  I recommend you rest and use ice.  You should get a good pair of shoes with good support as well.  If you continue with pain, please follow up with a podiatrist.  You may call [facility] at  [phone] for an appointment if needed.     ED Prescriptions     Medication Sig Dispense Auth. Provider   naproxen (NAPROSYN) 500 MG tablet Take 1 tablet (500 mg total) by mouth 2 (two) times daily. 30 tablet Jannifer Franklin, MD      PDMP not reviewed this encounter.   Jannifer Franklin, MD 06/19/23 [id number]

## 2023-06-22 ENCOUNTER — Other Ambulatory Visit (HOSPITAL_COMMUNITY): Payer: 59

## 2023-06-23 ENCOUNTER — Other Ambulatory Visit (HOSPITAL_COMMUNITY): Payer: 59

## 2023-06-24 ENCOUNTER — Other Ambulatory Visit (HOSPITAL_COMMUNITY): Payer: 59

## 2023-06-25 ENCOUNTER — Other Ambulatory Visit (HOSPITAL_COMMUNITY): Payer: 59

## 2023-06-26 ENCOUNTER — Other Ambulatory Visit (HOSPITAL_COMMUNITY): Payer: 59

## 2023-06-29 ENCOUNTER — Other Ambulatory Visit (HOSPITAL_COMMUNITY): Payer: 59

## 2023-06-30 ENCOUNTER — Other Ambulatory Visit (HOSPITAL_COMMUNITY): Payer: 59

## 2023-07-01 ENCOUNTER — Other Ambulatory Visit (HOSPITAL_COMMUNITY): Payer: 59

## 2023-07-02 ENCOUNTER — Other Ambulatory Visit (HOSPITAL_COMMUNITY): Payer: 59

## 2023-07-08 ENCOUNTER — Encounter (HOSPITAL_COMMUNITY): Payer: Self-pay

## 2023-07-13 ENCOUNTER — Encounter (HOSPITAL_COMMUNITY): Payer: Self-pay

## 2023-07-13 ENCOUNTER — Ambulatory Visit (INDEPENDENT_AMBULATORY_CARE_PROVIDER_SITE_OTHER): Payer: Self-pay | Admitting: Clinical

## 2023-07-13 DIAGNOSIS — F319 Bipolar disorder, unspecified: Secondary | ICD-10-CM
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
## 2023-08-10 ENCOUNTER — Other Ambulatory Visit: Payer: Self-pay

## 2023-08-10 ENCOUNTER — Ambulatory Visit (HOSPITAL_BASED_OUTPATIENT_CLINIC_OR_DEPARTMENT_OTHER): Admitting: Family

## 2023-08-10 ENCOUNTER — Encounter (HOSPITAL_COMMUNITY): Payer: Self-pay | Admitting: Family

## 2023-08-10 DIAGNOSIS — F4312 Post-traumatic stress disorder, chronic: Secondary | ICD-10-CM

## 2023-08-10 MED ORDER — HYDROXYZINE HCL 25 MG PO TABS: 25.0000 mg | ORAL_TABLET | Freq: Three times a day (TID) | ORAL | 1 refills | Status: AC | PRN

## 2023-08-10 NOTE — Progress Notes (Addendum)
 [facility] MD/PA/NP OP Progress Note  08/10/2023 2:04 PM Wendy Pitts  MRN:  [id number]  Chief Complaint:  " I am not taking any of my medications because it made me overly emotional."   Evaluation: Wendy Pitts 28 year old African-American female presents for medication management follow-up appointment.  Seen and evaluated face-to-face by this provider.  She carries a diagnosis related to bipolar disorder, major depressive disorder, generalized disorder.  She was initiated on Prozac , Geodon, trazodone  and hydroxyzine  while hospitalized.  States she has recently self discontinued medication due to feelings of " overly emotional."  Reports her symptoms are stemming from poor concentration and inability to focus.  States she would like to treat her attention deficit disorder symptoms as opposed to depression and anxiety.  Does report passive suicidal ideations.  Denied plan or intent.  States that she has always struggled with suicidal thoughts.  Self discontinued Prozac  40 mg and Geodon 60  mg daily - Declined reinitiating psychotropic medications at this time.  States she would prefer to be tested for adult ADHD symptoms. Stated she continues to use Hydroxyzine  25 mg-50 mg PRN   Wendy Pitts reports she has recently started birth control however her symptoms have been ongoing prior to birth control management.  She reports she would like to continue not taking any medications at this time.  States she cannot follow-up with [place] attention specialist and/or [facility] for adult ADHD testing.  States she would like to continue taking hydroxyzine  as needed.  Reports plans to attend college classes studying phlebotomy and then go on to pursue case management degree.  No other documented concerns at this visit.  Support, encouragement and reassurance was provided.  Patient to follow-up after assessment by ADHD specialist.     Visit Diagnosis: No diagnosis found.  Past Psychiatric History: Diagnosis with  bipolar 1 disorder, major depressive disorder generalized anxiety disorder.  Hospital discharge on Latuda  60 mg daily, Prozac  20 mg daily hydroxyzine  25 to 50 mg as needed as needed.  And trazodone  25 mg nightly.  Past Medical History:  Past Medical History:  Diagnosis Date   Anxiety    Bipolar 1 disorder (HCC)    History reviewed. No pertinent surgical history.  Family Psychiatric History: see chart  Family History:  Family History  Problem Relation Age of Onset   Bipolar disorder Mother     Social History:  Social History   Socioeconomic History   Marital status: Single    Spouse name: Not on file   Number of children: Not on file   Years of education: Not on file   Highest education level: Not on file  Occupational History   Not on file  Tobacco Use   Smoking status: Never   Smokeless tobacco: Never  Vaping Use   Vaping status: Never Used  Substance and Sexual Activity   Alcohol use: No   Drug use: No   Sexual activity: Yes    Birth control/protection: None  Other Topics Concern   Not on file  Social History Narrative   Not on file   Social Drivers of Health   Financial Resource Strain: Not on file  Food Insecurity: Food Insecurity Present (05/19/2023)   Hunger Vital Sign    Worried About Running Out of Food in the Last Year: Sometimes true    Ran Out of Food in the Last Year: Sometimes true  Transportation Needs: Unmet Transportation Needs (05/19/2023)   PRAPARE - Administrator, Civil Service (Medical): Yes  Lack of Transportation (Non-Medical): Yes  Physical Activity: Not on file  Stress: Not on file  Social Connections: Not on file    Allergies: No Known Allergies  Metabolic Disorder Labs: No results found for: "HGBA1C", "MPG" No results found for: "PROLACTIN" No results found for: "CHOL", "TRIG", "HDL", "CHOLHDL", "VLDL", "LDLCALC" No results found for: "TSH"  Therapeutic Level Labs: No results found for: "LITHIUM" No results  found for: "VALPROATE" No results found for: "CBMZ"  Current Medications: Current Outpatient Medications  Medication Sig Dispense Refill   JUNEL FE 1/20 1-20 MG-MCG tablet Take 1 tablet by mouth daily.     naproxen  (NAPROSYN ) 500 MG tablet Take 1 tablet (500 mg total) by mouth 2 (two) times daily. 30 tablet 0   traZODone  (DESYREL ) 50 MG tablet Take 1 tablet (50 mg total) by mouth at bedtime as needed for sleep. 30 tablet 0   FLUoxetine  (PROZAC ) 20 MG capsule Take 1 capsule (20 mg total) by mouth daily. (Patient not taking: Reported on 08/10/2023) 30 capsule 1   hydrOXYzine  (ATARAX ) 25 MG tablet Take 1 tablet (25 mg total) by mouth 3 (three) times daily as needed for anxiety. 30 tablet 1   Lurasidone  HCl 60 MG TABS Take 1 tablet (60 mg total) by mouth at bedtime. (Patient not taking: Reported on 08/10/2023) 30 tablet 1   No current facility-administered medications for this visit.     Musculoskeletal: Strength & Muscle Tone: within normal limits Gait & Station: normal Patient leans: N/A  Psychiatric Specialty Exam: Review of Systems  Psychiatric/Behavioral:  Positive for decreased concentration and sleep disturbance. Negative for hallucinations. The patient is nervous/anxious.   All other systems reviewed and are negative.   Height 5\' 4"  (1.626 m), weight (!) 340 lb (154.2 kg).Body mass index is 58.36 kg/m.  General Appearance: Casual  Eye Contact:  Good  Speech:  Clear and Coherent  Volume:  Normal  Mood:  Anxious and Depressed  Affect:  Congruent  Thought Process:  Coherent  Orientation:  Full (Time, Place, and Person)  Thought Content: Logical   Suicidal Thoughts:  Yes.  without intent/plan  Homicidal Thoughts:  No  Memory:  Immediate;   Good Recent;   Good  Judgement:  Good  Insight:  Good  Psychomotor Activity:  Normal  Concentration:  Concentration: Good  Recall:  Good  Fund of Knowledge: Good  Language: Fair  Akathisia:  No  Handed:  Right  AIMS (if indicated):  not done  Assets:  Communication Skills Desire for Improvement Social Support  ADL's:  Intact  Cognition: WNL  Sleep:  Fair   Screenings: AUDIT    Flowsheet Row [facility]  Alcohol Use Disorder Identification Test Final Score (AUDIT) 0      GAD-7    Flowsheet Row Counselor from 06/03/2023 in [facility]  Total GAD-7 Score 18      PHQ2-9    Flowsheet Row Counselor from 06/08/2023 [facility] Counselor from 06/03/2023 in [facility]  PHQ-2 Total Score 2 1 4   PHQ-9 Total Score 12 12 13       Flowsheet Row ED from 06/19/2023 in [facility] from 06/08/2023 in [facility] Admission (Discharged[facility]  C-SSRS RISK CATEGORY No Risk Error: Question  6 not populated High Risk        Assessment and Plan:  Carlyle Noecker is a 28 year old African-American female presents for medication management follow-up appointment.  She carries a diagnosis related to bipolar 1 disorder, generalized anxiety disorder and major depressive disorder.  She was discharged from inpatient admission on Latuda  60 mg, Prozac  20 mg trazodone  25 to 50 mg nightly.  Patient reports self discontinuing psychotropic medications.   Stated she did not like the way the medications made her feel.  Continues to endorse passive suicidal ideations.  Denied plan or intent.  She appeared future and goal oriented stating that she is needed to start phlebotomy school and would like to be tested for attention deficit disorder to help with her concentration and focus.  Patient to make follow-up appointment as needed.  Collaboration of Care: Collaboration of Care:  Medication Management AEB continue hydroxyzine  25 to 50 mg as needed  Patient/Guardian was advised Release of Information must be obtained prior to any record release in order to collaborate their care with an outside provider. Patient/Guardian was advised if they have not already done so to contact the registration department to sign all necessary forms in order for us  to release information regarding their care.   Consent: Patient/Guardian gives verbal consent for treatment and assignment of benefits for services provided during this visit. Patient/Guardian expressed understanding and agreed to proceed.    Levester Reagin, NP 08/10/2023, 2:04 PM

## 2023-08-24 ENCOUNTER — Ambulatory Visit (HOSPITAL_COMMUNITY): Admitting: Clinical

## 2023-09-07 ENCOUNTER — Ambulatory Visit (HOSPITAL_COMMUNITY): Admitting: Clinical
